# Patient Record
Sex: Female | Born: 1940
Health system: Southern US, Community
[De-identification: ages and names within clinical notes are randomized; demographics above are authoritative.]

## PROBLEM LIST (undated history)

## (undated) DIAGNOSIS — K449 Diaphragmatic hernia without obstruction or gangrene: Secondary | ICD-10-CM

## (undated) DIAGNOSIS — Z860100 Personal history of colon polyps, unspecified: Secondary | ICD-10-CM

## (undated) DIAGNOSIS — I619 Nontraumatic intracerebral hemorrhage, unspecified: Secondary | ICD-10-CM

## (undated) DIAGNOSIS — K219 Gastro-esophageal reflux disease without esophagitis: Secondary | ICD-10-CM

## (undated) DIAGNOSIS — I10 Essential (primary) hypertension: Secondary | ICD-10-CM

## (undated) DIAGNOSIS — G2581 Restless legs syndrome: Secondary | ICD-10-CM

## (undated) DIAGNOSIS — E7800 Pure hypercholesterolemia, unspecified: Secondary | ICD-10-CM

## (undated) DIAGNOSIS — D649 Anemia, unspecified: Secondary | ICD-10-CM

## (undated) DIAGNOSIS — K648 Other hemorrhoids: Secondary | ICD-10-CM

## (undated) DIAGNOSIS — H269 Unspecified cataract: Secondary | ICD-10-CM

## (undated) DIAGNOSIS — K2210 Ulcer of esophagus without bleeding: Secondary | ICD-10-CM

## (undated) DIAGNOSIS — R7303 Prediabetes: Secondary | ICD-10-CM

## (undated) DIAGNOSIS — F411 Generalized anxiety disorder: Secondary | ICD-10-CM

## (undated) DIAGNOSIS — R0602 Shortness of breath: Secondary | ICD-10-CM

## (undated) DIAGNOSIS — K222 Esophageal obstruction: Secondary | ICD-10-CM

## (undated) HISTORY — DX: Restless legs syndrome: G25.81

## (undated) HISTORY — DX: Prediabetes: R73.03

## (undated) HISTORY — DX: Personal history of colonic polyps: Z86.010

## (undated) HISTORY — DX: Nontraumatic intracerebral hemorrhage, unspecified: I61.9

## (undated) HISTORY — DX: Diaphragmatic hernia without obstruction or gangrene: K44.9

## (undated) HISTORY — DX: Personal history of colon polyps, unspecified: Z86.0100

## (undated) HISTORY — DX: Ulcer of esophagus without bleeding: K22.10

## (undated) HISTORY — DX: Generalized anxiety disorder: F41.1

## (undated) HISTORY — DX: Unspecified cataract: H26.9

## (undated) HISTORY — PX: POLYPECTOMY: SHX149

## (undated) HISTORY — DX: Gastro-esophageal reflux disease without esophagitis: K21.9

## (undated) HISTORY — PX: PARTIAL HYSTERECTOMY: SHX80

## (undated) HISTORY — DX: Anemia, unspecified: D64.9

## (undated) HISTORY — DX: Esophageal obstruction: K22.2

## (undated) HISTORY — DX: Other hemorrhoids: K64.8

## (undated) HISTORY — DX: Essential (primary) hypertension: I10

## (undated) HISTORY — PX: TONSILLECTOMY: SUR1361

## (undated) HISTORY — DX: Pure hypercholesterolemia, unspecified: E78.00

## (undated) HISTORY — DX: Shortness of breath: R06.02

## (undated) HISTORY — PX: BREAST BIOPSY: SHX20

---

## 1993-08-26 DIAGNOSIS — I619 Nontraumatic intracerebral hemorrhage, unspecified: Secondary | ICD-10-CM

## 1993-08-26 HISTORY — DX: Nontraumatic intracerebral hemorrhage, unspecified: I61.9

## 1998-05-22 ENCOUNTER — Other Ambulatory Visit: Admission: RE | Admit: 1998-05-22 | Discharge: 1998-05-22 | Payer: Self-pay | Admitting: Gastroenterology

## 1998-06-05 ENCOUNTER — Other Ambulatory Visit: Admission: RE | Admit: 1998-06-05 | Discharge: 1998-06-05 | Payer: Self-pay | Admitting: Gastroenterology

## 1999-08-21 ENCOUNTER — Other Ambulatory Visit: Admission: RE | Admit: 1999-08-21 | Discharge: 1999-08-21 | Payer: Self-pay | Admitting: Family Medicine

## 2001-03-19 ENCOUNTER — Emergency Department (HOSPITAL_COMMUNITY): Admission: EM | Admit: 2001-03-19 | Discharge: 2001-03-19 | Payer: Self-pay

## 2003-09-05 ENCOUNTER — Observation Stay (HOSPITAL_COMMUNITY): Admission: EM | Admit: 2003-09-05 | Discharge: 2003-09-06 | Payer: Self-pay | Admitting: Emergency Medicine

## 2003-09-08 ENCOUNTER — Encounter: Admission: RE | Admit: 2003-09-08 | Discharge: 2003-09-08 | Payer: Self-pay | Admitting: Family Medicine

## 2005-06-04 ENCOUNTER — Ambulatory Visit: Payer: Self-pay | Admitting: Gastroenterology

## 2005-08-05 ENCOUNTER — Other Ambulatory Visit: Admission: RE | Admit: 2005-08-05 | Discharge: 2005-08-05 | Payer: Self-pay | Admitting: Family Medicine

## 2008-05-31 ENCOUNTER — Ambulatory Visit: Payer: Self-pay | Admitting: Gastroenterology

## 2008-06-13 ENCOUNTER — Ambulatory Visit: Payer: Self-pay | Admitting: Gastroenterology

## 2010-11-05 HISTORY — PX: CHONDROPLASTY: SHX5177

## 2010-11-05 HISTORY — PX: KNEE ARTHROSCOPY W/ MENISCECTOMY: SHX1879

## 2011-01-11 NOTE — Discharge Summary (Signed)
NAME:  MONTEEN, TOOPS                            ACCOUNT NO.:  [id number]   MEDICAL RECORD NO.:  [id number]                   PATIENT TYPE:  INP   LOCATION:  4729                                 FACILITY:  [facility]   PHYSICIAN:  Leighton Roach McDiarmid, M.D.             DATE OF BIRTH:  1941-06-09   DATE OF ADMISSION:  09/05/2003  DATE OF DISCHARGE:  09/06/2003                                 DISCHARGE SUMMARY   FINAL DIAGNOSES:  1. Atypical chest pain, rule out myocardial infarction.  2. Gastroesophageal reflux disease.  3. Hypercholesterolemia.  4. Depression.  5. History of cerebral aneurysm.   PHYSICIANS:  1. Primary care physician, Dr. Nathanial Rancher at [facility].  2. Consulting physicians:  None.   PROCEDURE:  1. Chest x-ray September 05, 2003:  No acute cardiopulmonary disease.  2. EKG September 05, 2003:  Rate of 60 beats per minute, normal sinus rhythm,     no acute ST or T wave changes, right bundle branch block.  3. EKG September 06, 2003:  Rate of 67 beats per minute, normal sinus rhythm,     no acute ST or T wave changes, right bundle branch block.  4. Telemetry from January 10 to January 11. The patient was in normal sinus     rhythm the entire time.   LABORATORY DATA:  Admission labs:  White count 5.4, hemoglobin 12.5,  platelets 313. Sodium 142, potassium 3.9, chloride 105, bicarb 31, BUN 13,  creatinine 0.9, glucose 114. Liver function tests were within normal limits.  PT was 17.4, INR of 0.9. D-dimer was normal at 0.32. Initial point of care  cardiac enzymes were within normal limits; CK-MB of 1.0, troponin of less  than 0.05, myoglobin 43.3; two subsequent sets of cardiac enzymes were also  negative, CKs of 75 and 69, CK-MB of 0.9 and 0.8 and troponin of 0.01 and  0.01.   HISTORY OF PRESENT ILLNESS:  The patient is a 70 year old white female who  woke up the morning of January 10 with chest tightness around 7:00. She  described the pain as 7/10, a burning sensation  radiating into the back with  diaphoresis. NO nausea and no vomiting. The patient had never had this  experienced before. The patient also has complained of shortness of breath  with exertion for the last week. With this initial episode yesterday  morning, the patient found relief within 20 minutes without nitroglycerin.   PAST MEDICAL HISTORY:  1. Hypercholesterolemia.  2. GERD.  3. Depression.   SOCIAL HISTORY:  The patient denies any tobacco, no alcohol, no drugs. Has a  family history of a father who died of heart problems in his 21s, a sister  with a MI in her 42s, and an uncle with a MI in his 34s.   HOSPITAL COURSE:  1. Atypical chest pain, rule out myocardial infarction. Initial EKG and  point of care cardiac enzymes were within normal limits. Chest x-ray     showed no acute disease. The patient was placed on telemetry, and cardiac     enzymes were cycled every eight hours. Subsequent sets were negative x2.     The patient was pain free throughout admission, discharged home with     outpatient exercise treadmill test scheduled with [facility] with the likely cause being GERD-related symptoms.  2. Gastroesophageal reflux disease. The patient has history of     gastroesophageal reflux disease. She is on Nexium 40 mg q.h.s. The     patient admits to gaining 5 to 10 pounds over the holidays. Recent weight     gain could contribute to the one week of dyspnea on exertion and the GERD     symptoms. The patient also admits to eating jalapeno peppers multiple     times a day. The patient was instructed that this could contribute or     have caused her pain and to refrain from eating heavy meals prior to     going to bed and also to sleep with the head of the bed elevated.  3. Hypercholesterolemia. Lipid panel was obtained on admission. Cholesterol     of 214, triglycerides of 180, HDL of 49, and LDL of 129. The patient was     discharged home on her normal  home dose of Pravachol 40 mg q.d.  4. Depression. Stable throughout the admission. Continue home regimen of     Paxil 20 mg p.o. q.d.   FOLLOW UP:  The patient was instructed to followup with Dr. Nathanial Rancher at [facility] on Tuesday, January 18, at 10:45 in the morning. She was  also instructed to follow up with [facility] for an  exercise treadmill test. The date of that appointment was actually pending  at the time of discharge, but the patient was given that time.   DISCHARGE INSTRUCTIONS:  The patient was instructed to resume activity as  tolerated and return to [facility] emergency department if  pain returns.   DISCHARGE MEDICATIONS:  1. Pravachol 40 mg p.o. q.d.  2. Nexium 40 mg p.o. b.i.d. for two weeks, then return to 40 mg p.o. q.h.s.     afterwards.  3. Paxil 20 mg p.o. q.d.  4. Aspirin 81 mg p.o. q.d.  5. Amoxicillin 500 mg p.o. t.i.d. to finish a previous prescription for a     left tooth abscess.      Silas Sacramento, M.D.                         Etta Grandchild, M.D.    Jearld Pies  D:  09/06/2003  T:  09/06/2003  Job:  [id number]   cc:   Nathanial Rancher, M.D.   [facility]

## 2011-10-17 ENCOUNTER — Ambulatory Visit: Payer: Self-pay | Admitting: Gastroenterology

## 2011-10-25 ENCOUNTER — Ambulatory Visit (INDEPENDENT_AMBULATORY_CARE_PROVIDER_SITE_OTHER): Payer: Medicare PPO | Admitting: Gastroenterology

## 2011-10-25 ENCOUNTER — Other Ambulatory Visit (INDEPENDENT_AMBULATORY_CARE_PROVIDER_SITE_OTHER): Payer: Medicare PPO

## 2011-10-25 ENCOUNTER — Encounter: Payer: Self-pay | Admitting: Gastroenterology

## 2011-10-25 DIAGNOSIS — Z860100 Personal history of colon polyps, unspecified: Secondary | ICD-10-CM

## 2011-10-25 DIAGNOSIS — K219 Gastro-esophageal reflux disease without esophagitis: Secondary | ICD-10-CM

## 2011-10-25 DIAGNOSIS — E66813 Obesity, class 3: Secondary | ICD-10-CM

## 2011-10-25 DIAGNOSIS — R1310 Dysphagia, unspecified: Secondary | ICD-10-CM | POA: Insufficient documentation

## 2011-10-25 DIAGNOSIS — Z79899 Other long term (current) drug therapy: Secondary | ICD-10-CM

## 2011-10-25 LAB — IBC PANEL: Saturation Ratios: 14.7 % — ABNORMAL LOW (ref 20.0–50.0)

## 2011-10-25 LAB — FERRITIN: Ferritin: 35.3 ng/mL (ref 10.0–291.0)

## 2011-10-25 LAB — HIGH SENSITIVITY CRP: CRP, High Sensitivity: 5.64 mg/L — ABNORMAL HIGH (ref 0.000–5.000)

## 2011-10-25 MED ORDER — DEXLANSOPRAZOLE 60 MG PO CPDR: 60.0000 mg | DELAYED_RELEASE_CAPSULE | Freq: Every day | ORAL | Status: DC

## 2011-10-25 MED ORDER — PEG-KCL-NACL-NASULF-NA ASC-C 100 G PO SOLR: 1.0000 | Freq: Once | ORAL | Status: DC

## 2011-10-25 NOTE — Patient Instructions (Signed)
Your procedure has been scheduled for 11/04/2011, please follow the seperate instructions.  Please go to the basement today for your labs.  Your prescription(s) have been sent to you pharmacy.  Stop your Nexium and start the Dexilant samples.    Gastroesophageal Reflux Disease, Adult Gastroesophageal reflux disease (GERD) happens when acid from your stomach flows up into the esophagus. When acid comes in contact with the esophagus, the acid causes soreness (inflammation) in the esophagus. Over time, GERD may create small holes (ulcers) in the lining of the esophagus. CAUSES   Increased body weight. This puts pressure on the stomach, making acid rise from the stomach into the esophagus.   Smoking. This increases acid production in the stomach.   Drinking alcohol. This causes decreased pressure in the lower esophageal sphincter (valve or ring of muscle between the esophagus and stomach), allowing acid from the stomach into the esophagus.   Late evening meals and a full stomach. This increases pressure and acid production in the stomach.   A malformed lower esophageal sphincter.  Sometimes, no cause is found. SYMPTOMS   Burning pain in the lower part of the mid-chest behind the breastbone and in the mid-stomach area. This may occur twice a week or more often.   Trouble swallowing.   Sore throat.   Dry cough.   Asthma-like symptoms including chest tightness, shortness of breath, or wheezing.  DIAGNOSIS  Your caregiver may be able to diagnose GERD based on your symptoms. In some cases, X-rays and other tests may be done to check for complications or to check the condition of your stomach and esophagus. TREATMENT  Your caregiver may recommend over-the-counter or prescription medicines to help decrease acid production. Ask your caregiver before starting or adding any new medicines.  HOME CARE INSTRUCTIONS   Change the factors that you can control. Ask your caregiver for guidance  concerning weight loss, quitting smoking, and alcohol consumption.   Avoid foods and drinks that make your symptoms worse, such as:   Caffeine or alcoholic drinks.   Chocolate.   Peppermint or mint flavorings.   Garlic and onions.   Spicy foods.   Citrus fruits, such as oranges, lemons, or limes.   Tomato-based foods such as sauce, chili, salsa, and pizza.   Fried and fatty foods.   Avoid lying down for the 3 hours prior to your bedtime or prior to taking a nap.   Eat small, frequent meals instead of large meals.   Wear loose-fitting clothing. Do not wear anything tight around your waist that causes pressure on your stomach.   Raise the head of your bed 6 to 8 inches with wood blocks to help you sleep. Extra pillows will not help.   Only take over-the-counter or prescription medicines for pain, discomfort, or fever as directed by your caregiver.   Do not take aspirin, ibuprofen, or other nonsteroidal anti-inflammatory drugs (NSAIDs).  SEEK IMMEDIATE MEDICAL CARE IF:   You have pain in your arms, neck, jaw, teeth, or back.   Your pain increases or changes in intensity or duration.   You develop nausea, vomiting, or sweating (diaphoresis).   You develop shortness of breath, or you faint.   Your vomit is green, yellow, black, or looks like coffee grounds or blood.   Your stool is red, bloody, or black.  These symptoms could be signs of other problems, such as heart disease, gastric bleeding, or esophageal bleeding. MAKE SURE YOU:   Understand these instructions.   Will watch your  condition.   Will get help right away if you are not doing well or get worse.  Document Released: 05/22/2005 Document Revised: 04/24/2011 Document Reviewed: 03/01/2011 [facility] Patient Information 2012 [place], [place].

## 2011-10-25 NOTE — Progress Notes (Signed)
History of Present Illness:  This is a pleasant 71 year old Caucasian female with chronic GERD who has had several months of worsening acid reflux partially responsive to Nexium 40 mg before breakfast. She continues with progressive solid food dysphagia and some reflux symptoms. Her dysphagia is for solid foods and is located in the distal substernal area. She denies painful swallowing or a hepatobiliary complaints. Review of her chart shows long history of multiple recurrent colon polyps, and last exam was 5 years ago. It see where she has had previous endoscopic exam. She has been on Fosamax 70 mg every 7 days, but this is being held by her primary care physician pending GI evaluation. Her appetite is good her weight is stable. Family history is noncontributory.  I have reviewed this patient's present history, medical and surgical past history, allergies and medications.     ROS: The remainder of the 10 point ROS is negative... No history of Raynaud's phenomenon or other symptoms of collagen vascular disease.  Allergies  Allergen Reactions  . Simvastatin     Leg pain   Outpatient Prescriptions Prior to Visit  Medication Sig Dispense Refill  . ALPRAZolam (XANAX) 0.5 MG tablet Take 0.5 mg by mouth 3 (three) times daily as needed.      Marland Kitchen aspirin 81 MG tablet Take 81 mg by mouth daily.      . Calcium-Vitamin D 600-125 MG-UNIT TABS Take 1 tablet by mouth daily.      . Multiple Vitamin (MULTIVITAMIN) capsule Take 1 capsule by mouth daily.      Marland Kitchen PARoxetine (PAXIL) 20 MG tablet Take 20 mg by mouth every morning.      . vitamin E 400 UNIT capsule Take 400 Units by mouth daily.      Marland Kitchen esomeprazole (NEXIUM) 40 MG capsule Take 40 mg by mouth daily before breakfast.      . alendronate (FOSAMAX) 70 MG tablet Take 70 mg by mouth every 7 (seven) days. Take with a full glass of water on an empty stomach....(ON HOLD)       Past Medical History  Diagnosis Date  . Personal history of colonic polyps 1999   villous adenoma  . Hiatal hernia   . Esophageal reflux   . Cerebral hemorrhage 1995  . Vitamin d deficiency   . Hypercholesterolemia   . Osteoporosis   . Prediabetes   . Anxiety state, unspecified   . Restless legs syndrome (RLS)   . Dysphagia    Past Surgical History  Procedure Date  . Partial hysterectomy age [age]  . Tonsillectomy   . Breast biopsy     bilateral  . Knee arthroscopy w/ meniscectomy 11/05/2010  . Chondroplasty 11/05/2010   History   Social History  . Marital Status: Married    Spouse Name: N/A    Number of Children: 2  . Years of Education: N/A   Occupational History  . retired works part time at [employer]    Social History Main Topics  . Smoking status: Former Games developer  . Smokeless tobacco: Never Used  . Alcohol Use: No  . Drug Use: No  . Sexually Active: None   Other Topics Concern  . None   Social History Narrative  . None   Family History  Problem Relation Age of Onset  . Hypertension Father   . Tuberculosis Mother   . Breast cancer Sister   . Mental illness Sister   . Colon cancer Neg Hx  Physical Exam: Healthy appearing patient in no distress. Blood pressure today 118/60 and pulse is 80 and regular. BMI is 42. Cannot appreciate stigmata of chronic liver disease. Examination of the oropharynx and neck area is unremarkable. General well developed well nourished patient in no acute distress, appearing her stated age Eyes PERRLA, no icterus, fundoscopic exam per opthamologist Skin no lesions noted Neck supple, no adenopathy, no thyroid enlargement, no tenderness Chest clear to percussion and auscultation Heart no significant murmurs, gallops or rubs noted Abdomen no hepatosplenomegaly masses or tenderness, BS normal.  Extremities no acute joint lesions, edema, phlebitis or evidence of cellulitis. Neurologic patient oriented x 3, cranial nerves intact, no focal neurologic deficits noted. Psychological mental status normal and  normal affect.  Assessment and plan: Chronic GERD with probable peptic stricture of her esophagus. I have scheduled endoscopy and possible dilatation, and we'll let her try Dexilant 60 mg before breakfast with strict antireflux maneuvers. She also is due for followup colonoscopy exam or her history of colon polyps. Chart review does show an endoscopic exam in 1999. At that time she had a moderately large hiatal hernia and erosive esophagitis.  Encounter Diagnoses  Name Primary?  Marland Kitchen Dysphagia Yes  . History of colon polyps

## 2011-10-28 ENCOUNTER — Encounter: Payer: Self-pay | Admitting: Gastroenterology

## 2011-10-28 LAB — FOLATE: Folate: >24.8 ng/mL (ref >5.9)

## 2011-10-30 ENCOUNTER — Telehealth: Payer: Self-pay | Admitting: Gastroenterology

## 2011-10-30 NOTE — Telephone Encounter (Signed)
Message copied by Arna Snipe on Wed Oct 30, 2011 10:37 AM ------      Message from: Harlow Mares D      Created: Fri Oct 25, 2011  4:39 PM       Please bill pt for no show on 10/17/2011

## 2011-11-01 ENCOUNTER — Telehealth: Payer: Self-pay

## 2011-11-01 ENCOUNTER — Ambulatory Visit (INDEPENDENT_AMBULATORY_CARE_PROVIDER_SITE_OTHER): Payer: Medicare PPO | Admitting: Gastroenterology

## 2011-11-01 DIAGNOSIS — E538 Deficiency of other specified B group vitamins: Secondary | ICD-10-CM

## 2011-11-01 MED ORDER — CYANOCOBALAMIN 1000 MCG/ML IJ SOLN
1000.0000 ug | INTRAMUSCULAR | Status: AC
  Administered 2011-11-01 – 2011-11-15 (×3): 1000 ug via INTRAMUSCULAR

## 2011-11-01 MED ORDER — CYANOCOBALAMIN 1000 MCG/ML IJ SOLN: 1000.0000 ug | Freq: Once | INTRAMUSCULAR | Status: DC

## 2011-11-01 NOTE — Telephone Encounter (Signed)
Spoke with patient and informed her of lab results and patient will come in today for Vitamin B12 injections.

## 2011-11-01 NOTE — Telephone Encounter (Signed)
Message copied by Jessee Avers on Fri Nov 01, 2011 10:12 AM ------      Message from: Mardella Layman      Created: Tue Oct 29, 2011  9:38 AM       Start B12 shots and therapy

## 2011-11-04 ENCOUNTER — Ambulatory Visit (AMBULATORY_SURGERY_CENTER): Payer: Medicare PPO | Admitting: Gastroenterology

## 2011-11-04 ENCOUNTER — Encounter: Payer: Self-pay | Admitting: Gastroenterology

## 2011-11-04 VITALS — BP 141/78 | HR 70 | Temp 96.2°F | Resp 20 | Ht 65.0 in | Wt 252.0 lb

## 2011-11-04 DIAGNOSIS — D126 Benign neoplasm of colon, unspecified: Secondary | ICD-10-CM

## 2011-11-04 DIAGNOSIS — K222 Esophageal obstruction: Secondary | ICD-10-CM

## 2011-11-04 DIAGNOSIS — Z1211 Encounter for screening for malignant neoplasm of colon: Secondary | ICD-10-CM

## 2011-11-04 DIAGNOSIS — Z860100 Personal history of colon polyps, unspecified: Secondary | ICD-10-CM | POA: Insufficient documentation

## 2011-11-04 DIAGNOSIS — R1310 Dysphagia, unspecified: Secondary | ICD-10-CM

## 2011-11-04 DIAGNOSIS — K219 Gastro-esophageal reflux disease without esophagitis: Secondary | ICD-10-CM

## 2011-11-04 MED ORDER — SODIUM CHLORIDE 0.9 % IV SOLN: 500.0000 mL | INTRAVENOUS | Status: DC

## 2011-11-04 MED ORDER — ESOMEPRAZOLE MAGNESIUM 40 MG PO CPDR: 40.0000 mg | DELAYED_RELEASE_CAPSULE | Freq: Two times a day (BID) | ORAL | Status: DC

## 2011-11-04 NOTE — Patient Instructions (Signed)
New rx for nexium 40 mg take one 2 x per day.  Rx was sent by computer to [facility] at [place], [place].  Discontinue Dexilant.  You may resume your other medications today.Appointment may to see Dr. Jarold Motto in the office on the 3rd floor for Thursday, April 11 at 8:45.  Handouts given to the pt on polyps, GERD, hiatal hernia and esophageal dilatation diet to follow the rest of the day.  You may resume your prior diet Tuesday AM.  YOU HAD AN ENDOSCOPIC PROCEDURE TODAY AT THE [facility]: Refer to the procedure report that was given to you for any specific questions about what was found during the examination.  If the procedure report does not answer your questions, please call your gastroenterologist to clarify.  If you requested that your care partner not be given the details of your procedure findings, then the procedure report has been included in a sealed envelope for you to review at your convenience later.  YOU SHOULD EXPECT: Some feelings of bloating in the abdomen. Passage of more gas than usual.  Walking can help get rid of the air that was put into your GI tract during the procedure and reduce the bloating. If you had a lower endoscopy (such as a colonoscopy or flexible sigmoidoscopy) you may notice spotting of blood in your stool or on the toilet paper. If you underwent a bowel prep for your procedure, then you may not have a normal bowel movement for a few days.  DIET:    Drink plenty of fluids but you should avoid alcoholic beverages for 24 hours.  Please follow the dilatation diet handout the rest of the day.  You may resume your prior diet Tuesday am.  ACTIVITY: Your care partner should take you home directly after the procedure.  You should plan to take it easy, moving slowly for the rest of the day.  You can resume normal activity the day after the procedure however you should NOT DRIVE or use heavy machinery for 24 hours (because of the sedation medicines used during the test).     SYMPTOMS TO REPORT IMMEDIATELY: A gastroenterologist can be reached at any hour.  During normal business hours, 8:30 AM to 5:00 PM Monday through Friday, call [phone].  After hours and on weekends, please call the GI answering service at [phone] who will take a message and have the physician on call contact you.   Following lower endoscopy (colonoscopy or flexible sigmoidoscopy):  Excessive amounts of blood in the stool  Significant tenderness or worsening of abdominal pains  Swelling of the abdomen that is new, acute  Fever of 100F or higher  Following upper endoscopy (EGD)  Vomiting of blood or coffee ground material  New chest pain or pain under the shoulder blades  Painful or persistently difficult swallowing  New shortness of breath  Fever of 100F or higher  Black, tarry-looking stools  FOLLOW UP: If any biopsies were taken you will be contacted by phone or by letter within the next 1-3 weeks.  Call your gastroenterologist if you have not heard about the biopsies in 3 weeks.  Our staff will call the home number listed on your records the next business day following your procedure to check on you and address any questions or concerns that you may have at that time regarding the information given to you following your procedure. This is a courtesy call and so if there is no answer at the home number and  we have not heard from you through the emergency physician on call, we will assume that you have returned to your regular daily activities without incident.  SIGNATURES/CONFIDENTIALITY: You and/or your care partner have signed paperwork which will be entered into your electronic medical record.  These signatures attest to the fact that that the information above on your After Visit Summary has been reviewed and is understood.  Full responsibility of the confidentiality of this discharge information lies with you and/or your care-partner.

## 2011-11-04 NOTE — Op Note (Signed)
[facility] [address] [place], [place]  [address]  COLONOSCOPY PROCEDURE REPORT  PATIENT:  Alison, York  MR#:  [id number] BIRTHDATE:  01-May-1941, 71 yrs. old  GENDER:  female ENDOSCOPIST:  Vania Rea. Jarold Motto, MD, [facility] REF. BY: PROCEDURE DATE:  11/04/2011 PROCEDURE:  Colonoscopy with snare polypectomy ASA CLASS:  Class II INDICATIONS:  history of pre-cancerous (adenomatous) colon polyps  MEDICATIONS:   propofol (Diprivan) 300 mg IV  DESCRIPTION OF PROCEDURE:   After the risks and benefits and of the procedure were explained, informed consent was obtained. Digital rectal exam was performed and revealed no abnormalities. The LB CF-H180AL [id number] endoscope was introduced through the anus and advanced to the cecum, which was identified by both the appendix and ileocecal valve.  The quality of the prep was excellent, using MoviPrep.  The instrument was then slowly withdrawn as the colon was fully examined. <<PROCEDUREIMAGES>>  FINDINGS:  ENDOSCOPIC FINDINGS:  A sessile polyp was found.  cm. cecal polyp HOT SNARE REMOVED.SEE PICTURES.VERY REDUNDANT COLON NOTED. This was otherwise a normal examination of the colon. Retroflexed views in the rectum revealed no abnormalities.    The scope was then withdrawn from the patient and the procedure completed.  COMPLICATIONS:  None ENDOSCOPIC IMPRESSION: 1) Sessile polyp 2) Otherwise normal examination R/O ADENOMA. RECOMMENDATIONS: 1) Repeat colonoscopy in 5 years if polyp adenomatous; otherwise 10 years  REPEAT EXAM:  No  ______________________________ Vania Rea. Jarold Motto, MD, [facility]  CC:  Burnell Blanks, MD  n. Rosalie DoctorVania Rea. Jeylin Woodmansee at 11/04/2011 02:03 PM  Gwenette Greet, [phone]

## 2011-11-04 NOTE — Progress Notes (Signed)
No complaints noted in the recovery room. Maw  Patient did not experience any of the following events: a burn prior to discharge; a fall within the facility; wrong site/side/patient/procedure/implant event; or a hospital transfer or hospital admission upon discharge from the facility. (G8907) Patient did not have preoperative order for IV antibiotic SSI prophylaxis. (G8918)  

## 2011-11-04 NOTE — Progress Notes (Signed)
PROPOFOL PER S CAMP CRNA. SEE SCANNED INTRA PROCEDURE REPORT. EWM  PT'S HEART RATE DECREASED TO 46 DURING ADVANCEMENT TO CECUM. MD MADE AWARE PER S CAMP CRNA, SCOPE HELD AND HEART RATE INCREASED TO 50'S, THEN TO 60'S. SATS MAINTAINED STABLE 97% WITH EPISODE. EWM  PT TOLERATED BOTH PROCEDURES WITH NO DISTRESS NOTED. PT MAINTAINED SATS WITH SKIN WARM AND DRY. EWM

## 2011-11-04 NOTE — Op Note (Signed)
[facility] [address] [place], [place]  [address]  ENDOSCOPY PROCEDURE REPORT  PATIENT:  Alison York, Alison York  MR#:  [id number] BIRTHDATE:  1941-05-29, 71 yrs. old  GENDER:  female  ENDOSCOPIST:  Vania Rea. Jarold Motto, MD, [facility] Referred by:  PROCEDURE DATE:  11/04/2011 PROCEDURE:  EGD, diagnostic 43235, [place] Dilation of Esophagus ASA CLASS:  Class II INDICATIONS:  GERD, dysphagia  MEDICATIONS:   There was residual sedation effect present from prior procedure., propofol (Diprivan) 120 mg IV TOPICAL ANESTHETIC:  DESCRIPTION OF PROCEDURE:   After the risks and benefits of the procedure were explained, informed consent was obtained.  The LB GIF-H180 [id number] endoscope was introduced through the mouth and advanced to the second portion of the duodenum.  The instrument was slowly withdrawn as the mucosa was fully examined. <<PROCEDUREIMAGES>>  A hiatal hernia was found. LARE PROLAPSING HIATIAL HERNIA NOTED.SEE PICTURES.  Multiple erosions were found at the gastroesophageal junction. NO DEFINITE STRICTURE NOTED.DILATED # 94F MALONEY DILATOR.NO HEME OR PAIN.  Otherwise the examination was normal.    Retroflexed views revealed a hiatal hernia.    The scope was then withdrawn from the patient and the procedure completed.  COMPLICATIONS:  None  ENDOSCOPIC IMPRESSION: 1) Hiatal hernia 2) Erosions, multiple at the gastroesophageal junction 3) Otherwise normal examination 4) A hiatal hernia LARGE HH WITH CHRONIC EROSIVE ESOPHAGITIS AND STRICTURE DILATED.  RECOMMENDATIONS: 1) Clear liquids until, then soft foods rest iof day. Resume prior diet tomorrow. 2) continue current medications 3) continue PPI  ______________________________ Vania Rea. Jarold Motto, MD, [facility]  CC:  Burnell Blanks, MD  n. Rosalie DoctorVania Rea. Rogerick Baldwin at 11/04/2011 02:09 PM  Gwenette Greet, [phone]

## 2011-11-05 ENCOUNTER — Telehealth: Payer: Self-pay | Admitting: *Deleted

## 2011-11-05 NOTE — Telephone Encounter (Signed)
  Follow up Call-  Call back number 11/04/2011  Post procedure Call Back phone  # [phone]  Permission to leave phone message Yes     Patient questions:  Do you have a fever, pain , or abdominal swelling? no Pain Score  0 *  Have you tolerated food without any problems? yes  Have you been able to return to your normal activities? yes  Do you have any questions about your discharge instructions: Diet   no Medications  no Follow up visit  no  Do you have questions or concerns about your Care? no  Actions: * If pain score is 4 or above: No action needed, pain <4.

## 2011-11-08 ENCOUNTER — Encounter: Payer: Self-pay | Admitting: Gastroenterology

## 2011-11-08 ENCOUNTER — Ambulatory Visit (INDEPENDENT_AMBULATORY_CARE_PROVIDER_SITE_OTHER): Payer: Medicare PPO | Admitting: Gastroenterology

## 2011-11-08 DIAGNOSIS — E538 Deficiency of other specified B group vitamins: Secondary | ICD-10-CM

## 2011-11-15 ENCOUNTER — Ambulatory Visit (INDEPENDENT_AMBULATORY_CARE_PROVIDER_SITE_OTHER): Payer: Medicare PPO | Admitting: Gastroenterology

## 2011-11-15 DIAGNOSIS — E538 Deficiency of other specified B group vitamins: Secondary | ICD-10-CM

## 2011-11-15 MED ORDER — CYANOCOBALAMIN 1000 MCG/ML IJ SOLN
1000.0000 ug | INTRAMUSCULAR | Status: AC
  Administered 2011-12-16 – 2012-03-18 (×4): 1000 ug via INTRAMUSCULAR

## 2011-12-02 ENCOUNTER — Encounter: Payer: Self-pay | Admitting: *Deleted

## 2011-12-05 ENCOUNTER — Ambulatory Visit (INDEPENDENT_AMBULATORY_CARE_PROVIDER_SITE_OTHER): Payer: Medicare PPO | Admitting: Gastroenterology

## 2011-12-05 ENCOUNTER — Encounter: Payer: Self-pay | Admitting: Gastroenterology

## 2011-12-05 VITALS — BP 122/70 | HR 68 | Ht 65.0 in | Wt 254.2 lb

## 2011-12-05 DIAGNOSIS — K219 Gastro-esophageal reflux disease without esophagitis: Secondary | ICD-10-CM

## 2011-12-05 DIAGNOSIS — Z86010 Personal history of colonic polyps: Secondary | ICD-10-CM

## 2011-12-05 DIAGNOSIS — R141 Gas pain: Secondary | ICD-10-CM

## 2011-12-05 DIAGNOSIS — R142 Eructation: Secondary | ICD-10-CM

## 2011-12-05 NOTE — Progress Notes (Signed)
History of Present Illness: This is a 71 year old Caucasian female who recently underwent colonoscopy with removal of a cecal adenoma. She is scheduled for followup exam in 5 years. She had severe reflux symptoms with dysphagia, underwent endoscopy with esophageal dilatation, and is currently asymptomatic on Nexium 40 mg twice a day. Her main complaint is abdominal gas and bloating made worse by fiber supplements. She denies any hepatobiliary complaints. Her dysphasia has completely resolved.    Current Medications, Allergies, Past Medical History, Past Surgical History, Family History and Social History were reviewed in [employer] record.   Assessment and plan: Chronic GERD which may require twice a day Nexium because of her recurrent peptic strictures over many years of followup.. We will continue twice a day therapy, and try to taper her Nexium as tolerated. I placed her on a " low gas diet" for review. She is to continue her other medications as listed and reviewed in her record with primary care followup as scheduled. Patient does also have B12 deficiency, and is on parenteral replacement therapy. She may have an element of bacterial overgrowth syndrome. Review of her record shows colon polyps going back some 15 years. No diagnosis found.

## 2011-12-05 NOTE — Patient Instructions (Addendum)
Continue your Nexium twice a day. Follow up in one year.  Follow the low gas diet given today.

## 2011-12-16 ENCOUNTER — Ambulatory Visit (INDEPENDENT_AMBULATORY_CARE_PROVIDER_SITE_OTHER): Payer: Medicare PPO | Admitting: Gastroenterology

## 2011-12-16 DIAGNOSIS — E538 Deficiency of other specified B group vitamins: Secondary | ICD-10-CM

## 2012-01-06 ENCOUNTER — Encounter: Payer: Self-pay | Admitting: *Deleted

## 2012-01-15 ENCOUNTER — Ambulatory Visit (INDEPENDENT_AMBULATORY_CARE_PROVIDER_SITE_OTHER): Payer: Medicare PPO | Admitting: Gastroenterology

## 2012-01-15 DIAGNOSIS — E538 Deficiency of other specified B group vitamins: Secondary | ICD-10-CM

## 2012-02-17 ENCOUNTER — Ambulatory Visit (INDEPENDENT_AMBULATORY_CARE_PROVIDER_SITE_OTHER): Payer: Medicare PPO | Admitting: Gastroenterology

## 2012-02-17 ENCOUNTER — Other Ambulatory Visit: Payer: Self-pay | Admitting: *Deleted

## 2012-02-17 DIAGNOSIS — E538 Deficiency of other specified B group vitamins: Secondary | ICD-10-CM

## 2012-02-17 DIAGNOSIS — R109 Unspecified abdominal pain: Secondary | ICD-10-CM

## 2012-03-18 ENCOUNTER — Ambulatory Visit (INDEPENDENT_AMBULATORY_CARE_PROVIDER_SITE_OTHER): Payer: Medicare PPO | Admitting: Gastroenterology

## 2012-03-18 DIAGNOSIS — E538 Deficiency of other specified B group vitamins: Secondary | ICD-10-CM

## 2012-04-28 ENCOUNTER — Ambulatory Visit (INDEPENDENT_AMBULATORY_CARE_PROVIDER_SITE_OTHER): Payer: Medicare PPO | Admitting: Gastroenterology

## 2012-04-28 DIAGNOSIS — E538 Deficiency of other specified B group vitamins: Secondary | ICD-10-CM

## 2012-04-28 MED ORDER — CYANOCOBALAMIN 1000 MCG/ML IJ SOLN
1000.0000 ug | INTRAMUSCULAR | Status: DC
  Administered 2012-04-28: 1000 ug via INTRAMUSCULAR

## 2012-05-26 ENCOUNTER — Telehealth: Payer: Self-pay | Admitting: Gastroenterology

## 2012-05-27 MED ORDER — CYANOCOBALAMIN 1000 MCG/ML IJ SOLN: INTRAMUSCULAR | Status: DC

## 2012-05-27 NOTE — Telephone Encounter (Signed)
Pt requested we order her B12 for self injection d/t the travel distance. Ordered.

## 2012-11-17 ENCOUNTER — Other Ambulatory Visit: Payer: Self-pay | Admitting: Gastroenterology

## 2013-01-03 ENCOUNTER — Other Ambulatory Visit: Payer: Self-pay | Admitting: Gastroenterology

## 2013-01-04 NOTE — Telephone Encounter (Signed)
PATIENT WILL NEED AN OFFICE VISIT FOR FURTHER REFILLS  

## 2013-12-13 ENCOUNTER — Other Ambulatory Visit: Payer: Self-pay | Admitting: Gastroenterology

## 2014-04-13 ENCOUNTER — Encounter: Payer: Self-pay | Admitting: Gastroenterology

## 2014-11-09 DIAGNOSIS — E780 Pure hypercholesterolemia: Secondary | ICD-10-CM | POA: Diagnosis not present

## 2014-11-09 DIAGNOSIS — R7309 Other abnormal glucose: Secondary | ICD-10-CM | POA: Diagnosis not present

## 2014-11-09 DIAGNOSIS — E538 Deficiency of other specified B group vitamins: Secondary | ICD-10-CM | POA: Diagnosis not present

## 2014-11-09 DIAGNOSIS — E559 Vitamin D deficiency, unspecified: Secondary | ICD-10-CM | POA: Diagnosis not present

## 2014-11-11 DIAGNOSIS — E780 Pure hypercholesterolemia: Secondary | ICD-10-CM | POA: Diagnosis not present

## 2014-11-11 DIAGNOSIS — F419 Anxiety disorder, unspecified: Secondary | ICD-10-CM | POA: Diagnosis not present

## 2014-11-11 DIAGNOSIS — Z1389 Encounter for screening for other disorder: Secondary | ICD-10-CM | POA: Diagnosis not present

## 2014-11-11 DIAGNOSIS — Z23 Encounter for immunization: Secondary | ICD-10-CM | POA: Diagnosis not present

## 2014-11-11 DIAGNOSIS — E559 Vitamin D deficiency, unspecified: Secondary | ICD-10-CM | POA: Diagnosis not present

## 2014-11-11 DIAGNOSIS — N183 Chronic kidney disease, stage 3 (moderate): Secondary | ICD-10-CM | POA: Diagnosis not present

## 2014-11-11 DIAGNOSIS — R7309 Other abnormal glucose: Secondary | ICD-10-CM | POA: Diagnosis not present

## 2014-11-11 DIAGNOSIS — Z9181 History of falling: Secondary | ICD-10-CM | POA: Diagnosis not present

## 2015-01-05 DIAGNOSIS — Z803 Family history of malignant neoplasm of breast: Secondary | ICD-10-CM | POA: Diagnosis not present

## 2015-01-05 DIAGNOSIS — Z1231 Encounter for screening mammogram for malignant neoplasm of breast: Secondary | ICD-10-CM | POA: Diagnosis not present

## 2015-01-05 DIAGNOSIS — M810 Age-related osteoporosis without current pathological fracture: Secondary | ICD-10-CM | POA: Diagnosis not present

## 2015-03-28 DIAGNOSIS — M5414 Radiculopathy, thoracic region: Secondary | ICD-10-CM | POA: Diagnosis not present

## 2015-03-28 DIAGNOSIS — Z6841 Body Mass Index (BMI) 40.0 and over, adult: Secondary | ICD-10-CM | POA: Diagnosis not present

## 2015-04-25 DIAGNOSIS — Z9181 History of falling: Secondary | ICD-10-CM | POA: Diagnosis not present

## 2015-04-25 DIAGNOSIS — M5414 Radiculopathy, thoracic region: Secondary | ICD-10-CM | POA: Diagnosis not present

## 2015-04-25 DIAGNOSIS — Z6841 Body Mass Index (BMI) 40.0 and over, adult: Secondary | ICD-10-CM | POA: Diagnosis not present

## 2015-04-25 DIAGNOSIS — Z1389 Encounter for screening for other disorder: Secondary | ICD-10-CM | POA: Diagnosis not present

## 2015-04-25 DIAGNOSIS — Z139 Encounter for screening, unspecified: Secondary | ICD-10-CM | POA: Diagnosis not present

## 2015-05-16 DIAGNOSIS — Z79899 Other long term (current) drug therapy: Secondary | ICD-10-CM | POA: Diagnosis not present

## 2015-05-16 DIAGNOSIS — N183 Chronic kidney disease, stage 3 (moderate): Secondary | ICD-10-CM | POA: Diagnosis not present

## 2015-05-16 DIAGNOSIS — E559 Vitamin D deficiency, unspecified: Secondary | ICD-10-CM | POA: Diagnosis not present

## 2015-05-16 DIAGNOSIS — R7309 Other abnormal glucose: Secondary | ICD-10-CM | POA: Diagnosis not present

## 2015-05-16 DIAGNOSIS — E538 Deficiency of other specified B group vitamins: Secondary | ICD-10-CM | POA: Diagnosis not present

## 2015-05-16 DIAGNOSIS — E780 Pure hypercholesterolemia: Secondary | ICD-10-CM | POA: Diagnosis not present

## 2015-05-19 DIAGNOSIS — F419 Anxiety disorder, unspecified: Secondary | ICD-10-CM | POA: Diagnosis not present

## 2015-05-19 DIAGNOSIS — R7301 Impaired fasting glucose: Secondary | ICD-10-CM | POA: Diagnosis not present

## 2015-05-19 DIAGNOSIS — E538 Deficiency of other specified B group vitamins: Secondary | ICD-10-CM | POA: Diagnosis not present

## 2015-05-19 DIAGNOSIS — E559 Vitamin D deficiency, unspecified: Secondary | ICD-10-CM | POA: Diagnosis not present

## 2015-05-19 DIAGNOSIS — Z23 Encounter for immunization: Secondary | ICD-10-CM | POA: Diagnosis not present

## 2015-05-19 DIAGNOSIS — N183 Chronic kidney disease, stage 3 (moderate): Secondary | ICD-10-CM | POA: Diagnosis not present

## 2015-05-19 DIAGNOSIS — E780 Pure hypercholesterolemia: Secondary | ICD-10-CM | POA: Diagnosis not present

## 2015-05-19 DIAGNOSIS — K219 Gastro-esophageal reflux disease without esophagitis: Secondary | ICD-10-CM | POA: Diagnosis not present

## 2015-11-16 DIAGNOSIS — E7800 Pure hypercholesterolemia, unspecified: Secondary | ICD-10-CM | POA: Diagnosis not present

## 2015-11-16 DIAGNOSIS — E538 Deficiency of other specified B group vitamins: Secondary | ICD-10-CM | POA: Diagnosis not present

## 2015-11-16 DIAGNOSIS — E559 Vitamin D deficiency, unspecified: Secondary | ICD-10-CM | POA: Diagnosis not present

## 2015-11-16 DIAGNOSIS — R7303 Prediabetes: Secondary | ICD-10-CM | POA: Diagnosis not present

## 2015-11-20 DIAGNOSIS — F419 Anxiety disorder, unspecified: Secondary | ICD-10-CM | POA: Diagnosis not present

## 2015-11-20 DIAGNOSIS — Z23 Encounter for immunization: Secondary | ICD-10-CM | POA: Diagnosis not present

## 2015-11-20 DIAGNOSIS — E538 Deficiency of other specified B group vitamins: Secondary | ICD-10-CM | POA: Diagnosis not present

## 2015-11-20 DIAGNOSIS — R7303 Prediabetes: Secondary | ICD-10-CM | POA: Diagnosis not present

## 2015-11-20 DIAGNOSIS — N183 Chronic kidney disease, stage 3 (moderate): Secondary | ICD-10-CM | POA: Diagnosis not present

## 2015-11-20 DIAGNOSIS — Z6841 Body Mass Index (BMI) 40.0 and over, adult: Secondary | ICD-10-CM | POA: Diagnosis not present

## 2015-11-20 DIAGNOSIS — E559 Vitamin D deficiency, unspecified: Secondary | ICD-10-CM | POA: Diagnosis not present

## 2015-11-20 DIAGNOSIS — E7800 Pure hypercholesterolemia, unspecified: Secondary | ICD-10-CM | POA: Diagnosis not present

## 2015-11-20 DIAGNOSIS — K219 Gastro-esophageal reflux disease without esophagitis: Secondary | ICD-10-CM | POA: Diagnosis not present

## 2016-01-05 DIAGNOSIS — H5231 Anisometropia: Secondary | ICD-10-CM | POA: Diagnosis not present

## 2016-01-05 DIAGNOSIS — H5210 Myopia, unspecified eye: Secondary | ICD-10-CM | POA: Diagnosis not present

## 2016-02-02 DIAGNOSIS — Z1231 Encounter for screening mammogram for malignant neoplasm of breast: Secondary | ICD-10-CM | POA: Diagnosis not present

## 2016-02-02 DIAGNOSIS — Z803 Family history of malignant neoplasm of breast: Secondary | ICD-10-CM | POA: Diagnosis not present

## 2016-05-23 DIAGNOSIS — N183 Chronic kidney disease, stage 3 (moderate): Secondary | ICD-10-CM | POA: Diagnosis not present

## 2016-05-23 DIAGNOSIS — E559 Vitamin D deficiency, unspecified: Secondary | ICD-10-CM | POA: Diagnosis not present

## 2016-05-23 DIAGNOSIS — R7303 Prediabetes: Secondary | ICD-10-CM | POA: Diagnosis not present

## 2016-05-23 DIAGNOSIS — E538 Deficiency of other specified B group vitamins: Secondary | ICD-10-CM | POA: Diagnosis not present

## 2016-05-23 DIAGNOSIS — Z79899 Other long term (current) drug therapy: Secondary | ICD-10-CM | POA: Diagnosis not present

## 2016-05-23 DIAGNOSIS — E7800 Pure hypercholesterolemia, unspecified: Secondary | ICD-10-CM | POA: Diagnosis not present

## 2016-05-27 DIAGNOSIS — Z1389 Encounter for screening for other disorder: Secondary | ICD-10-CM | POA: Diagnosis not present

## 2016-05-27 DIAGNOSIS — K219 Gastro-esophageal reflux disease without esophagitis: Secondary | ICD-10-CM | POA: Diagnosis not present

## 2016-05-27 DIAGNOSIS — F419 Anxiety disorder, unspecified: Secondary | ICD-10-CM | POA: Diagnosis not present

## 2016-05-27 DIAGNOSIS — Z9181 History of falling: Secondary | ICD-10-CM | POA: Diagnosis not present

## 2016-05-27 DIAGNOSIS — Z23 Encounter for immunization: Secondary | ICD-10-CM | POA: Diagnosis not present

## 2016-05-27 DIAGNOSIS — N183 Chronic kidney disease, stage 3 (moderate): Secondary | ICD-10-CM | POA: Diagnosis not present

## 2016-05-27 DIAGNOSIS — E7800 Pure hypercholesterolemia, unspecified: Secondary | ICD-10-CM | POA: Diagnosis not present

## 2016-05-27 DIAGNOSIS — Z139 Encounter for screening, unspecified: Secondary | ICD-10-CM | POA: Diagnosis not present

## 2016-05-27 DIAGNOSIS — R7303 Prediabetes: Secondary | ICD-10-CM | POA: Diagnosis not present

## 2016-08-12 ENCOUNTER — Encounter: Payer: Self-pay | Admitting: *Deleted

## 2016-08-26 HISTORY — PX: COLONOSCOPY: SHX174

## 2016-09-18 DIAGNOSIS — D485 Neoplasm of uncertain behavior of skin: Secondary | ICD-10-CM | POA: Diagnosis not present

## 2016-09-18 DIAGNOSIS — L820 Inflamed seborrheic keratosis: Secondary | ICD-10-CM | POA: Diagnosis not present

## 2016-09-23 ENCOUNTER — Encounter: Payer: Self-pay | Admitting: Internal Medicine

## 2016-11-04 DIAGNOSIS — K219 Gastro-esophageal reflux disease without esophagitis: Secondary | ICD-10-CM | POA: Diagnosis not present

## 2016-11-04 DIAGNOSIS — Z6841 Body Mass Index (BMI) 40.0 and over, adult: Secondary | ICD-10-CM | POA: Diagnosis not present

## 2016-11-04 DIAGNOSIS — R1011 Right upper quadrant pain: Secondary | ICD-10-CM | POA: Diagnosis not present

## 2016-11-07 DIAGNOSIS — R1011 Right upper quadrant pain: Secondary | ICD-10-CM | POA: Diagnosis not present

## 2016-11-18 ENCOUNTER — Emergency Department (HOSPITAL_COMMUNITY): Payer: Medicare HMO

## 2016-11-18 ENCOUNTER — Encounter (HOSPITAL_COMMUNITY): Payer: Self-pay

## 2016-11-18 ENCOUNTER — Emergency Department (HOSPITAL_COMMUNITY)
Admission: EM | Admit: 2016-11-18 | Discharge: 2016-11-18 | Disposition: A | Discharge status: Home / Self Care | Payer: Medicare HMO | Attending: Emergency Medicine | Admitting: Emergency Medicine

## 2016-11-18 DIAGNOSIS — Z87891 Personal history of nicotine dependence: Secondary | ICD-10-CM | POA: Insufficient documentation

## 2016-11-18 DIAGNOSIS — R1011 Right upper quadrant pain: Secondary | ICD-10-CM | POA: Insufficient documentation

## 2016-11-18 DIAGNOSIS — Z79899 Other long term (current) drug therapy: Secondary | ICD-10-CM | POA: Diagnosis not present

## 2016-11-18 DIAGNOSIS — K760 Fatty (change of) liver, not elsewhere classified: Secondary | ICD-10-CM | POA: Diagnosis not present

## 2016-11-18 DIAGNOSIS — Z7982 Long term (current) use of aspirin: Secondary | ICD-10-CM | POA: Insufficient documentation

## 2016-11-18 LAB — COMPREHENSIVE METABOLIC PANEL
ALK PHOS: 52 U/L (ref 38–126)
ALT: 25 U/L (ref 14–54)
ANION GAP: 8 (ref 5–15)
AST: 33 U/L (ref 15–41)
Albumin: 3.5 g/dL (ref 3.5–5.0)
BILIRUBIN TOTAL: 0.5 mg/dL (ref 0.3–1.2)
BUN: 10 mg/dL (ref 6–20)
CALCIUM: 8.9 mg/dL (ref 8.9–10.3)
CO2: 26 mmol/L (ref 22–32)
Chloride: 104 mmol/L (ref 101–111)
Creatinine, Ser: 1.11 mg/dL — ABNORMAL HIGH (ref 0.44–1.00)
GFR, EST AFRICAN AMERICAN: 54 mL/min — AB (ref >60)
GFR, EST NON AFRICAN AMERICAN: 47 mL/min — AB (ref >60)
Glucose, Bld: 97 mg/dL (ref 65–99)
POTASSIUM: 4 mmol/L (ref 3.5–5.1)
Sodium: 138 mmol/L (ref 135–145)
TOTAL PROTEIN: 6.2 g/dL — AB (ref 6.5–8.1)

## 2016-11-18 LAB — URINALYSIS, ROUTINE W REFLEX MICROSCOPIC
Bilirubin Urine: NEGATIVE
GLUCOSE, UA: NEGATIVE mg/dL
Hgb urine dipstick: NEGATIVE
KETONES UR: NEGATIVE mg/dL
NITRITE: NEGATIVE
Protein, ur: NEGATIVE mg/dL
SPECIFIC GRAVITY, URINE: 1.024 (ref 1.005–1.030)
pH: 5 (ref 5.0–8.0)

## 2016-11-18 LAB — CBC
HEMATOCRIT: 39 % (ref 36.0–46.0)
HEMOGLOBIN: 12.5 g/dL (ref 12.0–15.0)
MCH: 28.7 pg (ref 26.0–34.0)
MCHC: 32.1 g/dL (ref 30.0–36.0)
MCV: 89.4 fL (ref 78.0–100.0)
Platelets: 250 10*3/uL (ref 150–400)
RBC: 4.36 MIL/uL (ref 3.87–5.11)
RDW: 14.1 % (ref 11.5–15.5)
WBC: 7.3 10*3/uL (ref 4.0–10.5)

## 2016-11-18 LAB — LIPASE, BLOOD: Lipase: 17 U/L (ref 11–51)

## 2016-11-18 MED ORDER — HYDROCODONE-ACETAMINOPHEN 5-325 MG PO TABS: 1.0000 | ORAL_TABLET | Freq: Four times a day (QID) | ORAL | 0 refills | Status: DC | PRN

## 2016-11-18 MED ORDER — IOPAMIDOL (ISOVUE-300) INJECTION 61%
INTRAVENOUS | Status: AC
  Administered 2016-11-18: 100 mL
  Filled 2016-11-18: qty 100

## 2016-11-18 MED ORDER — PANTOPRAZOLE SODIUM 40 MG PO TBEC
40.0000 mg | DELAYED_RELEASE_TABLET | Freq: Once | ORAL | Status: AC
  Administered 2016-11-18: 40 mg via ORAL
  Filled 2016-11-18: qty 1

## 2016-11-18 MED ORDER — HYDROCODONE-ACETAMINOPHEN 5-325 MG PO TABS
1.0000 | ORAL_TABLET | Freq: Once | ORAL | Status: AC
  Administered 2016-11-18: 1 via ORAL
  Filled 2016-11-18: qty 1

## 2016-11-18 NOTE — ED Notes (Signed)
Pt returned from CT °

## 2016-11-18 NOTE — ED Triage Notes (Signed)
Pt reports right sided abdominal pain that radiates to right flank area.

## 2016-11-18 NOTE — ED Notes (Signed)
Pt transported to CT ?

## 2016-11-18 NOTE — ED Provider Notes (Signed)
**Note DeAlisonIdentified via Obfuscation** [facility] DEPT Provider Note   CSN: [id number] Arrival date & time: 11/18/16  1532     History   Chief Complaint Chief Complaint  Patient presents with  . Abdominal Pain    HPI Alison York is a 76 y.o. female.  The history is provided by the patient and a relative.  Abdominal Pain   This is a new problem. The current episode started more than 1 week ago. The problem occurs daily. The problem has been gradually worsening. The pain is associated with an unknown factor. The pain is located in the RUQ. The quality of the pain is burning and sharp. The pain is severe. Associated symptoms include nausea. Pertinent negatives include anorexia, fever, diarrhea, flatus, hematochezia, melena, vomiting, constipation, dysuria, frequency, hematuria, headaches, arthralgias and myalgias. The symptoms are aggravated by palpation and deep breathing. Nothing relieves the symptoms. Past workup includes ultrasound. Her past medical history is significant for GERD.     PMH GERD presents for four weeks intermittent worsening RUQ pain that radiates around from her back. Pt states pain starts in her right thoracic back and wraps around to epigastric area. Feels like sharp and burning needles. Pain comes on randomly but is also worse with palpation. Patient had previous pain on the right side like this over a year ago. She has never had shingles. No rash. PCP ordered abdominal ultrasound which reportedly showed sludge in the gallbladder but was otherwise negative.   Past Medical History:  Diagnosis Date  . Anxiety state, unspecified   . Cerebral hemorrhage ([place]) 1995  . Dysphagia   . Erosive esophagitis   . Esophageal reflux   . Esophageal stricture   . Hiatal hernia   . Hypercholesterolemia   . Osteoporosis   . Personal history of colonic polyps 1999 & 11/04/2011   villous adenoma & tubular adenoma  . Prediabetes   . Restless legs syndrome (RLS)   . Vitamin D deficiency     Patient Active  Problem List   Diagnosis Date Noted  . Esophageal reflux 11/04/2011  . Personal history of colonic polyps 11/04/2011  . Benign neoplasm of colon 11/04/2011  . Dysphagia 10/25/2011  . History of colon polyps 10/25/2011  . GERD (gastroesophageal reflux disease) 10/25/2011  . Obesity, Class III, BMI 40Alison49.9 (morbid obesity) ([place]) 10/25/2011    Past Surgical History:  Procedure Laterality Date  . BREAST BIOPSY     bilateral  . CHONDROPLASTY  11/05/2010  . KNEE ARTHROSCOPY W/ MENISCECTOMY  11/05/2010  . PARTIAL HYSTERECTOMY  age 21Alison35  . TONSILLECTOMY      OB History    No data available       Home Medications    Prior to Admission medications   Medication Sig Start Date End Date Taking? Authorizing Provider  alendronate (FOSAMAX) 70 MG tablet Take 70 mg by mouth every Thursday. Take with a full glass of water on an empty stomach....(ON HOLD)   Yes Historical Provider, Alison  ALPRAZolam (XANAX) 0.5 MG tablet Take 0.5 mg by mouth 3 (three) times daily as needed.   Yes Historical Provider, Alison  aspirin 81 MG tablet Take 81 mg by mouth daily.   Yes Historical Provider, Alison  CalciumAlisonVitamin D 600Alison125 MGAlisonUNIT TABS Take 1 tablet by mouth daily.   Yes Historical Provider, Alison  Multiple Vitamin (MULTIVITAMIN) capsule Take 1 capsule by mouth daily.   Yes Historical Provider, Alison  omeprazole (PRILOSEC) 40 MG capsule Take 40 mg by mouth daily. 11/05/16  Yes Historical Provider,  Alison  PARoxetine (PAXIL) 20 MG tablet Take 20 mg by mouth every morning.   Yes Historical Provider, Alison  vitamin BAlison12 (CYANOCOBALAMIN) 1000 MCG tablet Take 1,500 mcg by mouth daily.   Yes Historical Provider, Alison  HYDROcodoneAlisonacetaminophen (NORCO) 5Alison325 MG tablet Take 1 tablet by mouth every 6 (six) hours as needed for moderate pain. 11/18/16   Monico Blitz, Alison    Family History Family History  Problem Relation Age of Onset  . Hypertension Father   . Tuberculosis Mother   . Breast cancer Sister   . Mental  illness Sister   . Breast cancer Daughter   . Colon cancer Neg Hx     Social History Social History  Substance Use Topics  . Smoking status: Former Smoker    Quit date: 08/26/1972  . Smokeless tobacco: Never Used  . Alcohol use No     Allergies   Simvastatin   Review of Systems Review of Systems  Constitutional: Negative for fatigue and fever.  Respiratory: Negative for shortness of breath.   Cardiovascular: Negative for chest pain and palpitations.  Gastrointestinal: Positive for abdominal pain and nausea. Negative for anorexia, constipation, diarrhea, flatus, hematochezia, melena and vomiting.  Genitourinary: Positive for flank pain. Negative for dysuria, frequency and hematuria.  Musculoskeletal: Negative for arthralgias and myalgias.  Skin: Negative for rash and wound.  Neurological: Negative for lightAlisonheadedness and headaches.  All other systems reviewed and are negative.    Physical Exam Updated Vital Signs BP (!) 109/48   Pulse 64   Temp 97.9 F (36.6 C) (Oral)   Resp 17   Ht 5' 4.5" (1.638 m)   Wt 113.4 kg   SpO2 98%   BMI 42.25 kg/m   Physical Exam  Constitutional: She appears wellAlisondeveloped and wellAlisonnourished. No distress.  HENT:  Head: Normocephalic and atraumatic.  Eyes: Conjunctivae and EOM are normal.  Neck: Normal range of motion. Neck supple.  Cardiovascular: Normal rate and regular rhythm.   No murmur heard. Pulmonary/Chest: Effort normal and breath sounds normal. No respiratory distress.  Abdominal: Soft. She exhibits no distension. There is tenderness.  Dermatomal distribution of TTP originating in paraspinal musculature and extending to epigastric area. No overlying skin changes  Musculoskeletal: She exhibits no edema.  Neurological: She is alert.  Skin: Skin is warm and dry.  Psychiatric: She has a normal mood and affect.  Nursing note and vitals reviewed.    ED Treatments / Results  Labs (all labs ordered are listed, but only  abnormal results are displayed) Labs Reviewed  COMPREHENSIVE METABOLIC PANEL - Abnormal; Notable for the following:       Result Value   Creatinine, Ser 1.11 (*)    Total Protein 6.2 (*)    GFR calc non Af Amer 47 (*)    GFR calc Af Amer 54 (*)    All other components within normal limits  URINALYSIS, ROUTINE W REFLEX MICROSCOPIC - Abnormal; Notable for the following:    APPearance HAZY (*)    Leukocytes, UA LARGE (*)    Bacteria, UA RARE (*)    Squamous Epithelial / LPF 0Alison5 (*)    All other components within normal limits  LIPASE, BLOOD  CBC    EKG  EKG Interpretation None       Radiology Ct Abdomen Pelvis W Contrast  Result Date: 11/18/2016 CLINICAL DATA:  Right abdominal and flank pain for 4 weeks. EXAM: CT ABDOMEN AND PELVIS WITH CONTRAST TECHNIQUE: Multidetector CT imaging of the abdomen and  pelvis was performed using the standard protocol following bolus administration of intravenous contrast. CONTRAST:  146mL ISOVUEAlison300 IOPAMIDOL (ISOVUEAlison300) INJECTION 61% COMPARISON:  None. FINDINGS: Lower Chest: No acute findings. Hepatobiliary: No masses identified. Mild hepatic steatosis. Gallbladder is unremarkable. Pancreas:  No mass or inflammatory changes. Spleen: Within normal limits in size and appearance. Adrenals/Urinary Tract: No masses identified. Small cyst noted in lower pole of left kidney. No evidence of hydronephrosis. Unopacified urinary bladder is unremarkable in appearance. Stomach/Bowel: Small to moderate hiatal hernia. No evidence of obstruction, inflammatory process or abnormal fluid collections. Normal appendix visualized. Vascular/Lymphatic: No pathologically enlarged lymph nodes. No abdominal aortic aneurysm. Aortic atherosclerosis. Reproductive: Prior hysterectomy noted. Adnexal regions are unremarkable in appearance. Other:  None. Musculoskeletal:  No suspicious bone lesions identified. IMPRESSION: No acute findings within the abdomen or pelvis. Small moderate hiatal  hernia. Mild hepatic steatosis. Aortic atherosclerosis. Electronically Signed   By: Earle Gell M.D.   On: 11/18/2016 21:44    Procedures Procedures (including critical care time)  Medications Ordered in ED Medications  HYDROcodoneAlisonacetaminophen (NORCO/VICODIN) 5Alison325 MG per tablet 1 tablet (1 tablet Oral Given 11/18/16 1920)  pantoprazole (PROTONIX) EC tablet 40 mg (40 mg Oral Given 11/18/16 1920)  iopamidol (ISOVUEAlison300) 61 % injection (100 mLs  Contrast Given 11/18/16 2110)     Initial Impression / Assessment and Plan / ED Course  I have reviewed the triage vital signs and the nursing notes.  Pertinent labs & imaging results that were available during my care of the patient were reviewed by me and considered in my medical decision making (see chart for details).    76 y.o. female presents for right flank and RUQ pain in dermatomal distribution. VSS, NAD. Diffuse tenderness on exam, no overlying skin changes. No hx of back pain or herniated disc. Given PO pain medication. UA, lipase, CMP, CBC unremarkable. CT abdomen unremarkable. No clear etiology of pain, possible atypical presentation shingles. Feel appropriate for outAlisonpatient management. Given short Rx pain medication and advised f/u with PCP. Return precautions given. Pt voiced understanding and agreement with plan.   Final Clinical Impressions(s) / ED Diagnoses   Final diagnoses:  Right upper quadrant abdominal pain    New Prescriptions Discharge Medication List as of 11/18/2016 11:15 PM    START taking these medications   Details  HYDROcodoneAlisonacetaminophen (NORCO) 5Alison325 MG tablet Take 1 tablet by mouth every 6 (six) hours as needed for moderate pain., Starting Mon 11/18/2016, Print         Monico Blitz, Alison 11/19/16 0149    Carmin Muskrat, Alison 11/19/16 1009

## 2016-11-18 NOTE — ED Notes (Signed)
Called CT to get update, states pt is next. Family and pt made aware.

## 2016-11-19 DIAGNOSIS — R1011 Right upper quadrant pain: Secondary | ICD-10-CM | POA: Diagnosis not present

## 2016-11-19 DIAGNOSIS — K219 Gastro-esophageal reflux disease without esophagitis: Secondary | ICD-10-CM | POA: Diagnosis not present

## 2016-11-19 DIAGNOSIS — M546 Pain in thoracic spine: Secondary | ICD-10-CM | POA: Diagnosis not present

## 2016-11-19 DIAGNOSIS — R932 Abnormal findings on diagnostic imaging of liver and biliary tract: Secondary | ICD-10-CM | POA: Diagnosis not present

## 2016-11-19 DIAGNOSIS — Z6841 Body Mass Index (BMI) 40.0 and over, adult: Secondary | ICD-10-CM | POA: Diagnosis not present

## 2016-11-29 ENCOUNTER — Encounter: Payer: Self-pay | Admitting: Nurse Practitioner

## 2016-12-02 DIAGNOSIS — E538 Deficiency of other specified B group vitamins: Secondary | ICD-10-CM | POA: Diagnosis not present

## 2016-12-02 DIAGNOSIS — M810 Age-related osteoporosis without current pathological fracture: Secondary | ICD-10-CM | POA: Diagnosis not present

## 2016-12-02 DIAGNOSIS — R7303 Prediabetes: Secondary | ICD-10-CM | POA: Diagnosis not present

## 2016-12-02 DIAGNOSIS — F419 Anxiety disorder, unspecified: Secondary | ICD-10-CM | POA: Diagnosis not present

## 2016-12-02 DIAGNOSIS — Z79899 Other long term (current) drug therapy: Secondary | ICD-10-CM | POA: Diagnosis not present

## 2016-12-02 DIAGNOSIS — E7800 Pure hypercholesterolemia, unspecified: Secondary | ICD-10-CM | POA: Diagnosis not present

## 2016-12-02 DIAGNOSIS — E559 Vitamin D deficiency, unspecified: Secondary | ICD-10-CM | POA: Diagnosis not present

## 2016-12-02 DIAGNOSIS — R1011 Right upper quadrant pain: Secondary | ICD-10-CM | POA: Diagnosis not present

## 2016-12-02 DIAGNOSIS — N183 Chronic kidney disease, stage 3 (moderate): Secondary | ICD-10-CM | POA: Diagnosis not present

## 2016-12-10 ENCOUNTER — Ambulatory Visit: Payer: Medicare PPO | Admitting: Nurse Practitioner

## 2016-12-23 ENCOUNTER — Ambulatory Visit (INDEPENDENT_AMBULATORY_CARE_PROVIDER_SITE_OTHER): Payer: Medicare HMO | Admitting: Nurse Practitioner

## 2016-12-23 ENCOUNTER — Encounter (INDEPENDENT_AMBULATORY_CARE_PROVIDER_SITE_OTHER): Payer: Self-pay

## 2016-12-23 ENCOUNTER — Encounter: Payer: Self-pay | Admitting: Nurse Practitioner

## 2016-12-23 VITALS — BP 120/70 | HR 75 | Ht 65.0 in | Wt 252.0 lb

## 2016-12-23 DIAGNOSIS — K219 Gastro-esophageal reflux disease without esophagitis: Secondary | ICD-10-CM | POA: Diagnosis not present

## 2016-12-23 DIAGNOSIS — R1010 Upper abdominal pain, unspecified: Secondary | ICD-10-CM

## 2016-12-23 DIAGNOSIS — R1310 Dysphagia, unspecified: Secondary | ICD-10-CM | POA: Diagnosis not present

## 2016-12-23 DIAGNOSIS — Z86010 Personal history of colonic polyps: Secondary | ICD-10-CM | POA: Diagnosis not present

## 2016-12-23 DIAGNOSIS — K449 Diaphragmatic hernia without obstruction or gangrene: Secondary | ICD-10-CM | POA: Diagnosis not present

## 2016-12-23 MED ORDER — NA SULFATE-K SULFATE-MG SULF 17.5-3.13-1.6 GM/177ML PO SOLN: 1.0000 | Freq: Once | ORAL | 0 refills | Status: AC

## 2016-12-23 NOTE — Patient Instructions (Signed)
If you are age 76 or older, your body mass index should be between 23-30. Your Body mass index is 41.93 kg/m. If this is out of the aforementioned range listed, please consider follow up with your Primary Care Provider.  If you are age 63 or younger, your body mass index should be between 19-25. Your Body mass index is 41.93 kg/m. If this is out of the aformentioned range listed, please consider follow up with your Primary Care Provider.   You have been scheduled for an endoscopy and colonoscopy. Please follow the written instructions given to you at your visit today. Please pick up your prep supplies at the pharmacy within the next 1-3 days. If you use inhalers (even only as needed), please bring them with you on the day of your procedure. Your physician has requested that you go to www.startemmi.com and enter the access code given to you at your visit today. This web site gives a general overview about your procedure. However, you should still follow specific instructions given to you by our office regarding your preparation for the procedure.  You may have a light breakfast the morning of prep day (the day before the procedure).   You may choose from one of the following items: eggs and toast or chicken noodle soup and crackers.   You should have your breakfast completed between 8:00 and 9:00 am the day before your procedure.  After you have had your light breakfast you should start a clear liquid dietonly NO SOLIDS. No additional solid food is allowed. You may continue to have clear liquid up to 3 hours prior to your procedure.    Thank you for choosing me and [facility].  Tye Savoy, NP

## 2016-12-23 NOTE — Progress Notes (Addendum)
HPI:  Patient is a 76 year old female previously known to Dr. Sharlett Iles, last seen April 2013. She has a history of GERD and a large hiatal hernia. Patient is referred by PCP Cyndi Bender, PA for abdominal pain. Several weeks ago patient developed acute pain under her right shoulder blade. Over the following weeks the pain wrapped around to the front of her abdomen to the epigastrium. Pain described as stinging sensation with numbness over the right upper quadrant. Abd ultrasound was unremarkable except for fatty liver. It sounds like shingles was initially on the DDx list but she never did have any skin eruptions. The discomfort is not related to eating nor movement. Patients wonders if hiatal hernia has anything to do with the pain. A month ago while laying on left side patient felt something "pop". The then felt sensation of warm fluid running through her upper abdomen  Patient has a history of GERD, she has breakthrough symptoms on omeprazole. Nexium worked better but insurance stopped paying for years ago. She has frequent regurgitation of food, especially with a full meal. She has occasional solid food dysphagia. Esophagus was empirically dilated in March 2013.  Patient received a colonoscopy recall letter, she had a cecal adenoma without high grade dysplasia removed in 2013.   Past Medical History:  Diagnosis Date  . Anxiety state, unspecified   . Cerebral hemorrhage ([place]) 1995  . Dysphagia   . Erosive esophagitis   . Esophageal reflux   . Esophageal stricture   . Hiatal hernia   . Hypercholesterolemia   . Osteoporosis   . Personal history of colonic polyps 1999 & 11/04/2011   villous adenoma & tubular adenoma  . Prediabetes   . Restless legs syndrome (RLS)   . Vitamin D deficiency      Past Surgical History:  Procedure Laterality Date  . BREAST BIOPSY     bilateral  . CHONDROPLASTY  11/05/2010  . KNEE ARTHROSCOPY W/ MENISCECTOMY  11/05/2010  . PARTIAL HYSTERECTOMY   age [age]  . TONSILLECTOMY     Family History  Problem Relation Age of Onset  . Hypertension Father   . Tuberculosis Mother   . Breast cancer Sister   . Mental illness Sister   . Breast cancer Daughter   . Colon cancer Neg Hx    Social History  Substance Use Topics  . Smoking status: Former Smoker    Quit date: 08/26/1972  . Smokeless tobacco: Never Used  . Alcohol use No   Current Outpatient Prescriptions  Medication Sig Dispense Refill  . alendronate (FOSAMAX) 70 MG tablet Take 70 mg by mouth every Thursday. Take with a full glass of water on an empty stomach....(ON HOLD)    . ALPRAZolam (XANAX) 0.5 MG tablet Take 0.5 mg by mouth 3 (three) times daily as needed.    Marland Kitchen aspirin 81 MG tablet Take 81 mg by mouth daily.    . Calcium-Vitamin D 600-125 MG-UNIT TABS Take 1 tablet by mouth daily.    . Multiple Vitamin (MULTIVITAMIN) capsule Take 1 capsule by mouth daily.    Marland Kitchen omeprazole (PRILOSEC) 40 MG capsule Take 40 mg by mouth daily.    Marland Kitchen PARoxetine (PAXIL) 20 MG tablet Take 20 mg by mouth every morning.    . vitamin B-12 (CYANOCOBALAMIN) 1000 MCG tablet Take 1,500 mcg by mouth daily.     No current facility-administered medications for this visit.    Allergies  Allergen Reactions  . Simvastatin     Leg  pain     Review of Systems: Positive for anxiety, back pain, fatigue, shortness of breath. All other systems reviewed and negative except where noted in HPI.    Physical Exam: BP 120/70   Pulse 75   Ht 5\' 5"  (1.651 m)   Wt 252 lb (114.3 kg)   BMI 41.93 kg/m  Constitutional:  Well-developed female in no acute distress. Psychiatric: Normal mood and affect. Behavior is normal. EENT: . Conjunctivae are normal. No scleral icterus. Neck supple.  Cardiovascular: Normal rate, regular rhythm.  Pulmonary/chest: Effort normal and breath sounds normal. No wheezing, rales or rhonchi. Abdominal: Soft, nondistended, nontender. Bowel sounds active throughout. There are no masses  palpable. No hepatomegaly. Extremities: no edema Lymphadenopathy: No cervical adenopathy noted. Neurological: Alert and oriented to person place and time. Skin: Skin is warm and dry. No rashes noted.   ASSESSMENT AND PLAN:  1. 76 yo female with several week history of stinging pain under right shoulder blade radiating around to RUQ where there is also numbness. No acute findings on RUQ ultrasound. Initially felt to be beginning of shingles but didn't develop the lesions Pain is unrelated to eating or activity. No associated nausea. Pain does not sound GI in nature.  2. GERD / large hiatal hernia on last EGD in 2013. She is quite symptomatic with frequent regurgitation of food. She has occasional solid food dysphagia, empirical dilation in 2013 was helpful.  -For further evaluation of refractory GERD symptoms as well as recurrent dysphagia patient will be scheduled for EGD with possible dilation. The risks and benefits of EGD were discussed and the patient agrees to proceed.  -Hiatal hernia can be reassessed at time of EGD. She may need surgical referral  3. History of tubular adenomatous colon polyps without high grade dysplasia in 2013. Patient received a recall colonoscopy letter from Dr. Hilarie Fredrickson.  -Patient will be scheduled for a surveillance colonoscopy with possible polypectomy.  The risks and benefits of the procedure were discussed and the patient agrees to proceed.    Tye Savoy, NP  12/23/2016, 2:38 PM   Cyndi Bender, PA-C  Addendum: Reviewed and agree with initial management. Jerene Bears, MD

## 2017-01-15 ENCOUNTER — Encounter: Payer: Self-pay | Admitting: Internal Medicine

## 2017-01-23 DIAGNOSIS — M546 Pain in thoracic spine: Secondary | ICD-10-CM | POA: Diagnosis not present

## 2017-01-29 ENCOUNTER — Encounter: Payer: Self-pay | Admitting: Internal Medicine

## 2017-01-29 ENCOUNTER — Ambulatory Visit (AMBULATORY_SURGERY_CENTER): Payer: Medicare HMO | Admitting: Internal Medicine

## 2017-01-29 VITALS — BP 118/50 | HR 62 | Temp 98.6°F | Resp 17 | Ht 65.0 in | Wt 252.0 lb

## 2017-01-29 DIAGNOSIS — D125 Benign neoplasm of sigmoid colon: Secondary | ICD-10-CM | POA: Diagnosis not present

## 2017-01-29 DIAGNOSIS — D120 Benign neoplasm of cecum: Secondary | ICD-10-CM | POA: Diagnosis not present

## 2017-01-29 DIAGNOSIS — R1319 Other dysphagia: Secondary | ICD-10-CM

## 2017-01-29 DIAGNOSIS — K635 Polyp of colon: Secondary | ICD-10-CM

## 2017-01-29 DIAGNOSIS — Z860100 Personal history of colon polyps, unspecified: Secondary | ICD-10-CM

## 2017-01-29 DIAGNOSIS — D123 Benign neoplasm of transverse colon: Secondary | ICD-10-CM | POA: Diagnosis not present

## 2017-01-29 DIAGNOSIS — D122 Benign neoplasm of ascending colon: Secondary | ICD-10-CM

## 2017-01-29 DIAGNOSIS — K449 Diaphragmatic hernia without obstruction or gangrene: Secondary | ICD-10-CM | POA: Diagnosis not present

## 2017-01-29 DIAGNOSIS — R1310 Dysphagia, unspecified: Secondary | ICD-10-CM

## 2017-01-29 DIAGNOSIS — D124 Benign neoplasm of descending colon: Secondary | ICD-10-CM | POA: Diagnosis not present

## 2017-01-29 DIAGNOSIS — K219 Gastro-esophageal reflux disease without esophagitis: Secondary | ICD-10-CM

## 2017-01-29 DIAGNOSIS — K621 Rectal polyp: Secondary | ICD-10-CM | POA: Diagnosis not present

## 2017-01-29 MED ORDER — SODIUM CHLORIDE 0.9 % IV SOLN: 500.0000 mL | INTRAVENOUS | Status: DC

## 2017-01-29 MED ORDER — RANITIDINE HCL 150 MG PO TABS: ORAL_TABLET | ORAL | 3 refills | Status: DC

## 2017-01-29 NOTE — Op Note (Signed)
[place] [facility] Patient Name: Alison York Procedure Date: 01/29/2017 3:25 PM MRN: [id number] Endoscopist: Jerene Bears , MD Age: 76 Referring MD:  Date of Birth: May 25, 1941 Gender: Female Account #: [id number] Procedure:                Upper GI endoscopy Indications:              Abdominal pain in the right upper quadrant,                            Dysphagia (today patient states this is not a                            problem but was in the past before dilation by Dr.                            Sharlett Iles), Gastro-esophageal reflux disease Medicines:                Monitored Anesthesia Care Procedure:                Pre-Anesthesia Assessment:                           - Prior to the procedure, a History and Physical                            was performed, and patient medications and                            allergies were reviewed. The patient's tolerance of                            previous anesthesia was also reviewed. The risks                            and benefits of the procedure and the sedation                            options and risks were discussed with the patient.                            All questions were answered, and informed consent                            was obtained. Prior Anticoagulants: The patient has                            taken no previous anticoagulant or antiplatelet                            agents. ASA Grade Assessment: III - A patient with                            severe systemic disease. After reviewing the risks  and benefits, the patient was deemed in                            satisfactory condition to undergo the procedure.                           After obtaining informed consent, the endoscope was                            passed under direct vision. Throughout the                            procedure, the patient's blood pressure, pulse, and                            oxygen saturations were  monitored continuously. The                            Endoscope was introduced through the mouth, and                            advanced to the second part of duodenum. The upper                            GI endoscopy was accomplished without difficulty.                            The patient tolerated the procedure well. Scope In: Scope Out: Findings:                 Normal mucosa was found in the entire esophagus.                           A large hiatal hernia was found. The proximal                            extent of the gastric folds (end of tubular                            esophagus) was 31 cm from the incisors. The hiatal                            narrowing was 38 cm from the incisors.                           A single localized, small non-bleeding erosion was                            found in the cardia.                           The exam of the stomach was otherwise normal.  The examined duodenum was normal. Complications:            No immediate complications. Estimated Blood Loss:     Estimated blood loss: none. Impression:               - Normal mucosa was found in the entire esophagus.                           - Large hiatal hernia.                           - Non-bleeding erosive gastropathy related to                            hiatal hernia.                           - Normal examined duodenum.                           - No specimens collected. Recommendation:           - Patient has a contact number available for                            emergencies. The signs and symptoms of potential                            delayed complications were discussed with the                            patient. Return to normal activities tomorrow.                            Written discharge instructions were provided to the                            patient.                           - Resume previous diet.                           - Continue  present medications. Add ranitidine 150                            mg each evening. Jerene Bears, MD 01/29/2017 4:57:23 PM This report has been signed electronically.

## 2017-01-29 NOTE — Progress Notes (Signed)
Called to room to assist during endoscopic procedure.  Patient ID and intended procedure confirmed with present staff. Received instructions for my participation in the procedure from the performing physician.  

## 2017-01-29 NOTE — Patient Instructions (Addendum)
YOU HAD AN ENDOSCOPIC PROCEDURE TODAY AT [facility]:   Refer to the procedure report that was given to you for any specific questions about what was found during the examination.  If the procedure report does not answer your questions, please call your gastroenterologist to clarify.  If you requested that your care partner not be given the details of your procedure findings, then the procedure report has been included in a sealed envelope for you to review at your convenience later.  YOU SHOULD EXPECT: Some feelings of bloating in the abdomen. Passage of more gas than usual.  Walking can help get rid of the air that was put into your GI tract during the procedure and reduce the bloating. If you had a lower endoscopy (such as a colonoscopy or flexible sigmoidoscopy) you may notice spotting of blood in your stool or on the toilet paper. If you underwent a bowel prep for your procedure, you may not have a normal bowel movement for a few days.  Please Note:  You might notice some irritation and congestion in your nose or some drainage.  This is from the oxygen used during your procedure.  There is no need for concern and it should clear up in a day or so.  SYMPTOMS TO REPORT IMMEDIATELY:   Following lower endoscopy (colonoscopy or flexible sigmoidoscopy):  Excessive amounts of blood in the stool  Significant tenderness or worsening of abdominal pains  Swelling of the abdomen that is new, acute  Fever of 100F or higher   Following upper endoscopy (EGD)  Vomiting of blood or coffee ground material  New chest pain or pain under the shoulder blades  Painful or persistently difficult swallowing  New shortness of breath  Fever of 100F or higher  Black, tarry-looking stools  For urgent or emergent issues, a gastroenterologist can be reached at any hour by calling [phone].   DIET:  We do recommend a small meal at first, but then you may proceed to your regular diet.  Drink  plenty of fluids but you should avoid alcoholic beverages for 24 hours.  ACTIVITY:  You should plan to take it easy for the rest of today and you should NOT DRIVE or use heavy machinery until tomorrow (because of the sedation medicines used during the test).    FOLLOW UP: Our staff will call the number listed on your records the next business day following your procedure to check on you and address any questions or concerns that you may have regarding the information given to you following your procedure. If we do not reach you, we will leave a message.  However, if you are feeling well and you are not experiencing any problems, there is no need to return our call.  We will assume that you have returned to your regular daily activities without incident.  If any biopsies were taken you will be contacted by phone or by letter within the next 1-3 weeks.  Please call us at [phone] if you have not heard about the biopsies in 3 weeks.    SIGNATURES/CONFIDENTIALITY: You and/or your care partner have signed paperwork which will be entered into your electronic medical record.  These signatures attest to the fact that that the information above on your After Visit Summary has been reviewed and is understood.  Full responsibility of the confidentiality of this discharge information lies with you and/or your care-partner.   Resume medications. Information given on hiatal hernia and polyps. Follow  up in office in 2 months.

## 2017-01-29 NOTE — Op Note (Signed)
[facility] Patient Name: Alison York Procedure Date: 01/29/2017 3:25 PM MRN: [id number] Endoscopist: Jerene Bears , MD Age: 76 Referring MD:  Date of Birth: [birth year] Gender: Female Account #: [id number] Procedure:                Colonoscopy Indications:              Surveillance: Personal history of adenomatous                            polyps on last colonoscopy 5 years ago Medicines:                Monitored Anesthesia Care Procedure:                Pre-Anesthesia Assessment:                           - Prior to the procedure, a History and Physical                            was performed, and patient medications and                            allergies were reviewed. The patient's tolerance of                            previous anesthesia was also reviewed. The risks                            and benefits of the procedure and the sedation                            options and risks were discussed with the patient.                            All questions were answered, and informed consent                            was obtained. Prior Anticoagulants: The patient has                            taken no previous anticoagulant or antiplatelet                            agents. ASA Grade Assessment: III - A patient with                            severe systemic disease. After reviewing the risks                            and benefits, the patient was deemed in                            satisfactory condition to undergo the procedure.  After obtaining informed consent, the colonoscope                            was passed under direct vision. Throughout the                            procedure, the patient's blood pressure, pulse, and                            oxygen saturations were monitored continuously. The                            Model PCF-[id number]) scope was introduced                            through the anus and  advanced to the the cecum,                            identified by appendiceal orifice and ileocecal                            valve. The colonoscopy was performed without                            difficulty. The patient tolerated the procedure                            well. The quality of the bowel preparation was                            good. The ileocecal valve, appendiceal orifice, and                            rectum were photographed. Scope In: 3:55:08 PM Scope Out: 4:36:32 PM Scope Withdrawal Time: 0 hours 37 minutes 0 seconds  Total Procedure Duration: 0 hours 41 minutes 24 seconds  Findings:                 The digital rectal exam was normal.                           A 15 mm polyp was found in the cecum. The polyp was                            sessile. The polyp was removed with a piecemeal                            technique using a cold snare. Resection and                            retrieval were complete.                           Five sessile polyps were found in the ascending  colon. The polyps were 5 to 10 mm in size. These                            polyps were removed with a cold snare. Resection                            and retrieval were complete.                           A 2 mm polyp was found in the ascending colon. The                            polyp was sessile. The polyp was removed with a                            cold biopsy forceps. Resection and retrieval were                            complete.                           Five sessile polyps were found in the transverse                            colon. The polyps were 4 to 9 mm in size. These                            polyps were removed with a cold snare. Resection                            and retrieval were complete.                           A 2 mm polyp was found in the transverse colon. The                            polyp was sessile. The polyp was  removed with a                            cold biopsy forceps. Resection and retrieval were                            complete.                           Three sessile polyps were found in the descending                            colon. The polyps were 4 to 8 mm in size. These                            polyps were removed with a cold snare. Resection  and retrieval were complete.                           Five sessile polyps were found in the sigmoid                            colon. The polyps were 4 to 7 mm in size. These                            polyps were removed with a cold snare. Resection                            and retrieval were complete.                           Two sessile polyps were found in the rectum. The                            polyps were 4 to 5 mm in size. These polyps were                            removed with a cold snare. Resection and retrieval                            were complete.                           Internal hemorrhoids were found during                            retroflexion. The hemorrhoids were small. Complications:            No immediate complications. Estimated Blood Loss:     Estimated blood loss was minimal. Impression:               - One 15 mm polyp in the cecum, removed piecemeal                            using a cold snare. Resected and retrieved.                           - Five 5 to 10 mm polyps in the ascending colon,                            removed with a cold snare. Resected and retrieved.                           - One 2 mm polyp in the ascending colon, removed                            with a cold biopsy forceps. Resected and retrieved.                           - Five 4  to 9 mm polyps in the transverse colon,                            removed with a cold snare. Resected and retrieved.                           - One 2 mm polyp in the transverse colon, removed                             with a cold biopsy forceps. Resected and retrieved.                           - Three 4 to 8 mm polyps in the descending colon,                            removed with a cold snare. Resected and retrieved.                           - Five 4 to 7 mm polyps in the sigmoid colon,                            removed with a cold snare. Resected and retrieved.                           - Two 4 to 5 mm polyps in the rectum, removed with                            a cold snare. Resected and retrieved.                           - Internal hemorrhoids. Recommendation:           - Patient has a contact number available for                            emergencies. The signs and symptoms of potential                            delayed complications were discussed with the                            patient. Return to normal activities tomorrow.                            Written discharge instructions were provided to the                            patient.                           - Resume previous diet.                           - Continue present medications.                           -  Await pathology results.                           - Repeat colonoscopy is recommended for                            surveillance of multiple polyps. The colonoscopy                            date will be determined after pathology results                            from today's exam become available for review. Jerene Bears, MD 01/29/2017 5:02:46 PM This report has been signed electronically.

## 2017-01-29 NOTE — Progress Notes (Signed)
Pt's states no medical or surgical changes since previsit or office visit. 

## 2017-01-29 NOTE — Progress Notes (Signed)
Report to PACU, RN, vss, BBS= Clear.  

## 2017-01-30 ENCOUNTER — Telehealth: Payer: Self-pay

## 2017-01-30 ENCOUNTER — Telehealth: Payer: Self-pay | Admitting: *Deleted

## 2017-01-30 NOTE — Telephone Encounter (Signed)
No answer, message left for the patient. 

## 2017-01-30 NOTE — Telephone Encounter (Signed)
  Follow up Call-  Call back number 01/29/2017  Post procedure Call Back phone  # [phone]  Permission to leave phone message Yes  Some recent data might be hidden     Patient questions:  Do you have a fever, pain , or abdominal swelling? No. Pain Score  0 *  Have you tolerated food without any problems? Yes.    Have you been able to return to your normal activities? Yes.    Do you have any questions about your discharge instructions: Diet   No. Medications  No. Follow up visit  No.  Do you have questions or concerns about your Care? No.  Actions: * If pain score is 4 or above: No action needed, pain <4.

## 2017-01-31 DIAGNOSIS — M549 Dorsalgia, unspecified: Secondary | ICD-10-CM | POA: Diagnosis not present

## 2017-01-31 DIAGNOSIS — M546 Pain in thoracic spine: Secondary | ICD-10-CM | POA: Diagnosis not present

## 2017-02-06 ENCOUNTER — Telehealth: Payer: Self-pay | Admitting: Internal Medicine

## 2017-02-06 ENCOUNTER — Encounter: Payer: Self-pay | Admitting: Internal Medicine

## 2017-02-06 NOTE — Telephone Encounter (Signed)
Pt called and wanted to know if she could take zantac 150mg  in the am as well as at bedtime. Discussed with pt that she could that it was not an issue.

## 2017-02-14 ENCOUNTER — Telehealth: Payer: Self-pay | Admitting: Internal Medicine

## 2017-02-14 NOTE — Telephone Encounter (Signed)
Left message for pt to call back.  Discussed results letter with pt and she is aware.

## 2017-03-20 ENCOUNTER — Encounter: Payer: Self-pay | Admitting: *Deleted

## 2017-04-07 ENCOUNTER — Ambulatory Visit: Payer: Medicare HMO | Admitting: Internal Medicine

## 2017-04-07 NOTE — Progress Notes (Signed)
No show letter sent.

## 2017-04-08 DIAGNOSIS — Z9181 History of falling: Secondary | ICD-10-CM | POA: Diagnosis not present

## 2017-04-08 DIAGNOSIS — Z136 Encounter for screening for cardiovascular disorders: Secondary | ICD-10-CM | POA: Diagnosis not present

## 2017-04-08 DIAGNOSIS — Z1389 Encounter for screening for other disorder: Secondary | ICD-10-CM | POA: Diagnosis not present

## 2017-04-08 DIAGNOSIS — Z6841 Body Mass Index (BMI) 40.0 and over, adult: Secondary | ICD-10-CM | POA: Diagnosis not present

## 2017-04-08 DIAGNOSIS — Z0000 Encounter for general adult medical examination without abnormal findings: Secondary | ICD-10-CM | POA: Diagnosis not present

## 2017-04-08 DIAGNOSIS — E785 Hyperlipidemia, unspecified: Secondary | ICD-10-CM | POA: Diagnosis not present

## 2017-04-10 DIAGNOSIS — M25562 Pain in left knee: Secondary | ICD-10-CM | POA: Diagnosis not present

## 2017-04-23 DIAGNOSIS — Z6841 Body Mass Index (BMI) 40.0 and over, adult: Secondary | ICD-10-CM | POA: Diagnosis not present

## 2017-04-23 DIAGNOSIS — L729 Follicular cyst of the skin and subcutaneous tissue, unspecified: Secondary | ICD-10-CM | POA: Diagnosis not present

## 2017-04-23 DIAGNOSIS — R21 Rash and other nonspecific skin eruption: Secondary | ICD-10-CM | POA: Diagnosis not present

## 2017-05-08 DIAGNOSIS — M25562 Pain in left knee: Secondary | ICD-10-CM | POA: Diagnosis not present

## 2017-05-22 DIAGNOSIS — M1712 Unilateral primary osteoarthritis, left knee: Secondary | ICD-10-CM | POA: Diagnosis not present

## 2017-06-03 DIAGNOSIS — N183 Chronic kidney disease, stage 3 (moderate): Secondary | ICD-10-CM | POA: Diagnosis not present

## 2017-06-03 DIAGNOSIS — E7800 Pure hypercholesterolemia, unspecified: Secondary | ICD-10-CM | POA: Diagnosis not present

## 2017-06-04 DIAGNOSIS — K219 Gastro-esophageal reflux disease without esophagitis: Secondary | ICD-10-CM | POA: Diagnosis not present

## 2017-06-04 DIAGNOSIS — N183 Chronic kidney disease, stage 3 (moderate): Secondary | ICD-10-CM | POA: Diagnosis not present

## 2017-06-04 DIAGNOSIS — R7303 Prediabetes: Secondary | ICD-10-CM | POA: Diagnosis not present

## 2017-06-04 DIAGNOSIS — M810 Age-related osteoporosis without current pathological fracture: Secondary | ICD-10-CM | POA: Diagnosis not present

## 2017-06-04 DIAGNOSIS — E7800 Pure hypercholesterolemia, unspecified: Secondary | ICD-10-CM | POA: Diagnosis not present

## 2017-06-04 DIAGNOSIS — Z23 Encounter for immunization: Secondary | ICD-10-CM | POA: Diagnosis not present

## 2017-06-04 DIAGNOSIS — Z79899 Other long term (current) drug therapy: Secondary | ICD-10-CM | POA: Diagnosis not present

## 2017-06-04 DIAGNOSIS — F419 Anxiety disorder, unspecified: Secondary | ICD-10-CM | POA: Diagnosis not present

## 2017-06-04 DIAGNOSIS — Z6841 Body Mass Index (BMI) 40.0 and over, adult: Secondary | ICD-10-CM | POA: Diagnosis not present

## 2017-12-03 DIAGNOSIS — E7800 Pure hypercholesterolemia, unspecified: Secondary | ICD-10-CM | POA: Diagnosis not present

## 2017-12-03 DIAGNOSIS — E559 Vitamin D deficiency, unspecified: Secondary | ICD-10-CM | POA: Diagnosis not present

## 2017-12-03 DIAGNOSIS — N183 Chronic kidney disease, stage 3 (moderate): Secondary | ICD-10-CM | POA: Diagnosis not present

## 2017-12-03 DIAGNOSIS — E538 Deficiency of other specified B group vitamins: Secondary | ICD-10-CM | POA: Diagnosis not present

## 2017-12-05 DIAGNOSIS — R0602 Shortness of breath: Secondary | ICD-10-CM | POA: Diagnosis not present

## 2017-12-05 DIAGNOSIS — K219 Gastro-esophageal reflux disease without esophagitis: Secondary | ICD-10-CM | POA: Diagnosis not present

## 2017-12-05 DIAGNOSIS — G2581 Restless legs syndrome: Secondary | ICD-10-CM | POA: Diagnosis not present

## 2017-12-05 DIAGNOSIS — N183 Chronic kidney disease, stage 3 (moderate): Secondary | ICD-10-CM | POA: Diagnosis not present

## 2017-12-05 DIAGNOSIS — M810 Age-related osteoporosis without current pathological fracture: Secondary | ICD-10-CM | POA: Diagnosis not present

## 2017-12-05 DIAGNOSIS — Z79899 Other long term (current) drug therapy: Secondary | ICD-10-CM | POA: Diagnosis not present

## 2017-12-05 DIAGNOSIS — F419 Anxiety disorder, unspecified: Secondary | ICD-10-CM | POA: Diagnosis not present

## 2017-12-05 DIAGNOSIS — E7800 Pure hypercholesterolemia, unspecified: Secondary | ICD-10-CM | POA: Diagnosis not present

## 2017-12-05 DIAGNOSIS — Z1231 Encounter for screening mammogram for malignant neoplasm of breast: Secondary | ICD-10-CM | POA: Diagnosis not present

## 2017-12-11 ENCOUNTER — Telehealth: Payer: Self-pay

## 2017-12-11 NOTE — Telephone Encounter (Signed)
Sent referral to scheduling 

## 2017-12-17 DIAGNOSIS — Z1231 Encounter for screening mammogram for malignant neoplasm of breast: Secondary | ICD-10-CM | POA: Diagnosis not present

## 2017-12-17 DIAGNOSIS — Z803 Family history of malignant neoplasm of breast: Secondary | ICD-10-CM | POA: Diagnosis not present

## 2017-12-17 DIAGNOSIS — M8589 Other specified disorders of bone density and structure, multiple sites: Secondary | ICD-10-CM | POA: Diagnosis not present

## 2017-12-26 ENCOUNTER — Encounter: Payer: Self-pay | Admitting: Internal Medicine

## 2018-01-22 DIAGNOSIS — M5431 Sciatica, right side: Secondary | ICD-10-CM | POA: Diagnosis not present

## 2018-02-04 ENCOUNTER — Encounter: Payer: Self-pay | Admitting: Cardiology

## 2018-02-04 ENCOUNTER — Ambulatory Visit: Payer: Medicare HMO | Admitting: Cardiology

## 2018-02-04 VITALS — BP 130/70 | HR 71 | Ht 65.0 in | Wt 250.0 lb

## 2018-02-04 DIAGNOSIS — N183 Chronic kidney disease, stage 3 unspecified: Secondary | ICD-10-CM | POA: Insufficient documentation

## 2018-02-04 DIAGNOSIS — K219 Gastro-esophageal reflux disease without esophagitis: Secondary | ICD-10-CM

## 2018-02-04 DIAGNOSIS — E7800 Pure hypercholesterolemia, unspecified: Secondary | ICD-10-CM

## 2018-02-04 DIAGNOSIS — R0789 Other chest pain: Secondary | ICD-10-CM

## 2018-02-04 DIAGNOSIS — R0600 Dyspnea, unspecified: Secondary | ICD-10-CM | POA: Diagnosis not present

## 2018-02-04 DIAGNOSIS — R0609 Other forms of dyspnea: Secondary | ICD-10-CM | POA: Diagnosis not present

## 2018-02-04 NOTE — Patient Instructions (Addendum)
Medication Instructions: Your physician recommends that you continue on your current medications as directed. Please refer to the Current Medication list given to you today.   Labwork: TODAY: LIPIDS  Procedures/Testing: Your physician has requested that you have a lexiscan myoview. For further information please visit [email or web address]. Please follow instruction sheet, as given.  Your physician has requested that you have an echocardiogram. Echocardiography is a painless test that uses sound waves to create images of your heart. It provides your doctor with information about the size and shape of your heart and how well your heart's chambers and valves are working. This procedure takes approximately one hour. There are no restrictions for this procedure.    Follow-Up: Your physician recommends that you schedule a follow-up appointment in: 1 month with Daune Perch NP   Any Additional Special Instructions Will Be Listed Below (If Applicable).   DASH Eating Plan DASH stands for "Dietary Approaches to Stop Hypertension." The DASH eating plan is a healthy eating plan that has been shown to reduce high blood pressure (hypertension). It may also reduce your risk for type 2 diabetes, heart disease, and stroke. The DASH eating plan may also help with weight loss. What are tips for following this plan? General guidelines  Avoid eating more than 2,300 mg (milligrams) of salt (sodium) a day. If you have hypertension, you may need to reduce your sodium intake to 1,500 mg a day.  Limit alcohol intake to no more than 1 drink a day for nonpregnant women and 2 drinks a day for men. One drink equals 12 oz of beer, 5 oz of wine, or 1 oz of hard liquor.  Work with your health care provider to maintain a healthy body weight or to lose weight. Ask what an ideal weight is for you.  Get at least 30 minutes of exercise that causes your heart to beat faster (aerobic exercise) most days of the week.  Activities may include walking, swimming, or biking.  Work with your health care provider or diet and nutrition specialist (dietitian) to adjust your eating plan to your individual calorie needs. Reading food labels  Check food labels for the amount of sodium per serving. Choose foods with less than 5 percent of the Daily Value of sodium. Generally, foods with less than 300 mg of sodium per serving fit into this eating plan.  To find whole grains, look for the word "whole" as the first word in the ingredient list. Shopping  Buy products labeled as "low-sodium" or "no salt added."  Buy fresh foods. Avoid canned foods and premade or frozen meals. Cooking  Avoid adding salt when cooking. Use salt-free seasonings or herbs instead of table salt or sea salt. Check with your health care provider or pharmacist before using salt substitutes.  Do not fry foods. Cook foods using healthy methods such as baking, boiling, grilling, and broiling instead.  Cook with heart-healthy oils, such as olive, canola, soybean, or sunflower oil. Meal planning   Eat a balanced diet that includes: ? 5 or more servings of fruits and vegetables each day. At each meal, try to fill half of your plate with fruits and vegetables. ? Up to 6-8 servings of whole grains each day. ? Less than 6 oz of lean meat, poultry, or fish each day. A 3-oz serving of meat is about the same size as a deck of cards. One egg equals 1 oz. ? 2 servings of low-fat dairy each day. ? A serving of nuts, seeds, or beans  5 times each week. ? Heart-healthy fats. Healthy fats called Omega-3 fatty acids are found in foods such as flaxseeds and coldwater fish, like sardines, salmon, and mackerel.  Limit how much you eat of the following: ? Canned or prepackaged foods. ? Food that is high in trans fat, such as fried foods. ? Food that is high in saturated fat, such as fatty meat. ? Sweets, desserts, sugary drinks, and other foods with added  sugar. ? Full-fat dairy products.  Do not salt foods before eating.  Try to eat at least 2 vegetarian meals each week.  Eat more home-cooked food and less restaurant, buffet, and fast food.  When eating at a restaurant, ask that your food be prepared with less salt or no salt, if possible. What foods are recommended? The items listed may not be a complete list. Talk with your dietitian about what dietary choices are best for you. Grains Whole-grain or whole-wheat bread. Whole-grain or whole-wheat pasta. Brown rice. [place] Morrow. Bulgur. Whole-grain and low-sodium cereals. Pita bread. Low-fat, low-sodium crackers. Whole-wheat flour tortillas. Vegetables Fresh or frozen vegetables (raw, steamed, roasted, or grilled). Low-sodium or reduced-sodium tomato and vegetable juice. Low-sodium or reduced-sodium tomato sauce and tomato paste. Low-sodium or reduced-sodium canned vegetables. Fruits All fresh, dried, or frozen fruit. Canned fruit in natural juice (without added sugar). Meat and other protein foods Skinless chicken or [place]. Ground chicken or [place]. Pork with fat trimmed off. Fish and seafood. Egg whites. Dried beans, peas, or lentils. Unsalted nuts, nut butters, and seeds. Unsalted canned beans. Lean cuts of beef with fat trimmed off. Low-sodium, lean deli meat. Dairy Low-fat (1%) or fat-free (skim) milk. Fat-free, low-fat, or reduced-fat cheeses. Nonfat, low-sodium ricotta or cottage cheese. Low-fat or nonfat yogurt. Low-fat, low-sodium cheese. Fats and oils Soft margarine without trans fats. Vegetable oil. Low-fat, reduced-fat, or light mayonnaise and salad dressings (reduced-sodium). Canola, safflower, olive, soybean, and sunflower oils. Avocado. Seasoning and other foods Herbs. Spices. Seasoning mixes without salt. Unsalted popcorn and pretzels. Fat-free sweets. What foods are not recommended? The items listed may not be a complete list. Talk with your dietitian about what  dietary choices are best for you. Grains Baked goods made with fat, such as croissants, muffins, or some breads. Dry pasta or rice meal packs. Vegetables Creamed or fried vegetables. Vegetables in a cheese sauce. Regular canned vegetables (not low-sodium or reduced-sodium). Regular canned tomato sauce and paste (not low-sodium or reduced-sodium). Regular tomato and vegetable juice (not low-sodium or reduced-sodium). Angie Fava. Olives. Fruits Canned fruit in a light or heavy syrup. Fried fruit. Fruit in cream or butter sauce. Meat and other protein foods Fatty cuts of meat. Ribs. Fried meat. Berniece Salines. Sausage. Bologna and other processed lunch meats. Salami. Fatback. Hotdogs. Bratwurst. Salted nuts and seeds. Canned beans with added salt. Canned or smoked fish. Whole eggs or egg yolks. Chicken or [place] with skin. Dairy Whole or 2% milk, cream, and half-and-half. Whole or full-fat cream cheese. Whole-fat or sweetened yogurt. Full-fat cheese. Nondairy creamers. Whipped toppings. Processed cheese and cheese spreads. Fats and oils Butter. Stick margarine. Lard. Shortening. Ghee. Bacon fat. Tropical oils, such as coconut, palm kernel, or palm oil. Seasoning and other foods Salted popcorn and pretzels. Onion salt, garlic salt, seasoned salt, table salt, and sea salt. Worcestershire sauce. Tartar sauce. Barbecue sauce. Teriyaki sauce. Soy sauce, including reduced-sodium. Steak sauce. Canned and packaged gravies. Fish sauce. Oyster sauce. Cocktail sauce. Horseradish that you find on the shelf. Ketchup. Mustard. Meat flavorings and tenderizers. Bouillon cubes.  Hot sauce and Tabasco sauce. Premade or packaged marinades. Premade or packaged taco seasonings. Relishes. Regular salad dressings. Where to find more information:  National Heart, Lung, and [place]: https://wilson-eaton.com/  American Heart Association: www.heart.org Summary  The DASH eating plan is a healthy eating plan that has been shown to reduce  high blood pressure (hypertension). It may also reduce your risk for type 2 diabetes, heart disease, and stroke.  With the DASH eating plan, you should limit salt (sodium) intake to 2,300 mg a day. If you have hypertension, you may need to reduce your sodium intake to 1,500 mg a day.  When on the DASH eating plan, aim to eat more fresh fruits and vegetables, whole grains, lean proteins, low-fat dairy, and heart-healthy fats.  Work with your health care provider or diet and nutrition specialist (dietitian) to adjust your eating plan to your individual calorie needs. This information is not intended to replace advice given to you by your health care provider. Make sure you discuss any questions you have with your health care provider. Document Released: 08/01/2011 Document Revised: 08/05/2016 Document Reviewed: 08/05/2016 Elsevier Interactive Patient Education  [employer].    If you need a refill on your cardiac medications before your next appointment, please call your pharmacy.

## 2018-02-04 NOTE — Progress Notes (Signed)
Cardiology Office Note:    Date:  02/04/2018   ID:  Alison York, DOB [birth year], MRN [id number]  PCP:  Cyndi Bender, PA-C  Cardiologist:   Daune Perch, NP/Dr. Rayann Heman   Referring MD: Cyndi Bender, PA-C   Chief Complaint  Patient presents with  . Shortness of Breath    History of Present Illness:    Alison York is a 77 y.o. female who is being seen today for the evaluation of Dyspnea on exertion at the request of Cyndi Bender, PA-C.  The patient has a past medical history significant for pre-diabetes, GERD, osteoporosis, hypercholesterolemia, CKD stage 3. At her PCP visit on 12/05/17 Alison York had complained of worsening shortness of breath with minimal exertion. She had reduced hear acitivty level, no longer going shopping due to shortness of breath just walking through the store. SHe was also short of breath taking a shower. She has had no prior cardiac workup. She is a non-smoker, she smoiked over a pack a day but quit at age 42.   Alison York is here today alone. She reports that in early April she was taking a shower and became weak, short of breath and felt like she was going to pass out. About a week prior to that she got up in the morning and as she was walking to the kitchen she felt a grabbing pain across her upper back accompanied by shortness of breath. It resolved in less than a minute after she sat down and put her head between her legs. She has had no symptoms except for occ mild lightheadedness since the shower episode. She does get mildly short of breath if she is exerting herself more than usual. She is easily tired like with washing the dishes and has to rest after. She denies edema, orthopnea, PND, palpitations, lightheadedness or syncope.   Last A1c was 5.9 in 04/2016   PAD Screen 02/04/2018  Previous PAD dx? No  Previous surgical procedure? No  Pain with walking? No  Feet/toe relief with dangling? No  Painful, non-healing ulcers? No  Extremities discolored? No       Past Medical History:  Diagnosis Date  . Anxiety state, unspecified   . Cerebral hemorrhage ([place]) 1995  . Dysphagia   . Erosive esophagitis   . Esophageal reflux   . Esophageal stricture   . Hiatal hernia   . Hypercholesterolemia   . Internal hemorrhoids   . Osteoporosis   . Personal history of colonic polyps 1999 & 11/04/2011   villous adenoma & tubular adenoma  . Prediabetes   . Restless legs syndrome (RLS)   . Vitamin D deficiency     Past Surgical History:  Procedure Laterality Date  . BREAST BIOPSY     bilateral  . CHONDROPLASTY  11/05/2010  . KNEE ARTHROSCOPY W/ MENISCECTOMY  11/05/2010  . PARTIAL HYSTERECTOMY  age 37-35  . TONSILLECTOMY      Current Medications: Current Meds  Medication Sig  . alendronate (FOSAMAX) 70 MG tablet Take 70 mg by mouth every Thursday. Take with a full glass of water on an empty stomach....(ON HOLD)  . ALPRAZolam (XANAX) 0.5 MG tablet Take 0.5 mg by mouth 3 (three) times daily as needed.  Marland Kitchen aspirin 81 MG tablet Take 81 mg by mouth daily.  . Calcium-Vitamin D 600-125 MG-UNIT TABS Take 1 tablet by mouth daily.  . Multiple Vitamin (MULTIVITAMIN) capsule Take 1 capsule by mouth daily.  . naproxen (NAPROSYN) 500 MG tablet Take 1 tablet by  mouth as needed.  Marland Kitchen omeprazole (PRILOSEC) 40 MG capsule Take 40 mg by mouth daily.  Marland Kitchen PARoxetine (PAXIL) 20 MG tablet Take 20 mg by mouth every morning.   Current Facility-Administered Medications for the 02/04/18 encounter (Office Visit) with Daune Perch, NP  Medication  . 0.9 %  sodium chloride infusion     Allergies:   Simvastatin   Social History   Socioeconomic History  . Marital status: Married    Spouse name: Not on file  . Number of children: 2  . Years of education: Not on file  . Highest education level: Not on file  Occupational History  . Occupation: retired works part time at [employer]  . Financial resource strain: Not on file  . Food insecurity:     Worry: Not on file    Inability: Not on file  . Transportation needs:    Medical: Not on file    Non-medical: Not on file  Tobacco Use  . Smoking status: Former Smoker    Last attempt to quit: 08/26/1972    Years since quitting: 45.4  . Smokeless tobacco: Never Used  Substance and Sexual Activity  . Alcohol use: No  . Drug use: No  . Sexual activity: Not on file  Lifestyle  . Physical activity:    Days per week: Not on file    Minutes per session: Not on file  . Stress: Not on file  Relationships  . Social connections:    Talks on phone: Not on file    Gets together: Not on file    Attends religious service: Not on file    Active member of club or organization: Not on file    Attends meetings of clubs or organizations: Not on file    Relationship status: Not on file  Other Topics Concern  . Not on file  Social History Narrative  . Not on file     Family History: The patient's family history includes Alzheimer's disease in her sister; Breast cancer in her daughter and sister; CAD in her mother; Diabetes in her sister; Heart failure in her sister; Hypertension in her father; Kidney cancer in her brother; Mental illness in her sister; Stroke in her father; Tuberculosis in her mother. There is no history of Colon cancer. ROS:   Please see the history of present illness.     All other systems reviewed and are negative.  EKGs/Labs/Other Studies Reviewed:    The following studies were reviewed today: None  EKG:  EKG is ordered today.  The ekg ordered today demonstrates NSR, 65 bpm, RBBB  Recent Labs: No results found for requested labs within last 8760 hours.   Recent Lipid Panel No results found for: CHOL, TRIG, HDL, CHOLHDL, VLDL, LDLCALC, LDLDIRECT  Physical Exam:    VS:  BP 130/70   Pulse 71   Ht 5\' 5"  (1.651 m)   Wt 250 lb (113.4 kg)   SpO2 97%   BMI 41.60 kg/m     Wt Readings from Last 3 Encounters:  02/04/18 250 lb (113.4 kg)  01/29/17 252 lb (114.3 kg)   12/23/16 252 lb (114.3 kg)     GEN:  Well nourished, well developed in no acute distress HEENT: Normal NECK: No JVD; No carotid bruits LYMPHATICS: No lymphadenopathy CARDIAC: RRR, no murmurs, rubs, gallops RESPIRATORY:  Clear to auscultation without rales, wheezing or rhonchi  ABDOMEN: Soft, non-tender, non-distended MUSCULOSKELETAL:  No edema; No deformity  SKIN: Warm and dry  NEUROLOGIC:  Alert and oriented x 3 PSYCHIATRIC:  Normal affect   ASSESSMENT:    1. Dyspnea, unspecified type   2. Chest tightness    PLAN:    This patient's case was discussed in depth with Dr. Rayann Heman. The plan below was formulated per our discussion.  In order of problems listed above:  Dyspnea on exertion: Pt had 2 episodes of sudden shortness of breath with weakness, one was associated with a grabbing pain across the back while walking in her home and one occurred while in the shower. None of these symptoms has reoccured since early April but she does have mild shortness of breath with more intense exertion and fatigue after daily activities like washing dishes. EKG shows RBBB. CVD risk factors include pre-diabetes, hypercholesterolemia, remote smoker, possibly family history (pt is vague) Her 10 year risk of CVD is 35.8%. With optimal risk factor modification it would be 14.9% Will check lexiscan myoview (pt unable to walk on treadmill due to knee pain) and echocardiogram for wall motion, LV function and valvulopathy.   We discussed risk factor modification including light exercise, health healthy diet and improving her cholesterol levels.  Will follow up after testing. If all testing normal, we can release her back to her PCP.  Hypercholesterolemia: On pravastatin (did not tolerate simvastatin due to myalgias in the legs). LDL was 145, HDL 61 in April but pt says that she was not taking her medication regularly at the time. Her PCP advised her to take it regularly and she has been. Will recheck lipid  panel to assess the adequacy of response to pravastatin.   CKD stage 3: SCr was 1.1 on 12/03/17.  Continues to take NSAIDs for arthritic knee pain.  GERD: EGD in 2018 showed some erosive gastropathy, hiatal hernia. Symptoms well controlled on daily omeprazole.  Medication Adjustments/Labs and Tests Ordered: Current medicines are reviewed at length with the patient today.  Concerns regarding medicines are outlined above. Labs and tests ordered and medication changes are outlined in the patient instructions below:  Patient Instructions  Medication Instructions: Your physician recommends that you continue on your current medications as directed. Please refer to the Current Medication list given to you today.   Labwork: TODAY: LIPIDS  Procedures/Testing: Your physician has requested that you have a lexiscan myoview. For further information please visit [email or web address]. Please follow instruction sheet, as given.  Your physician has requested that you have an echocardiogram. Echocardiography is a painless test that uses sound waves to create images of your heart. It provides your doctor with information about the size and shape of your heart and how well your heart's chambers and valves are working. This procedure takes approximately one hour. There are no restrictions for this procedure.    Follow-Up: Your physician recommends that you schedule a follow-up appointment in: 1 month with Daune Perch NP   Any Additional Special Instructions Will Be Listed Below (If Applicable).   DASH Eating Plan DASH stands for "Dietary Approaches to Stop Hypertension." The DASH eating plan is a healthy eating plan that has been shown to reduce high blood pressure (hypertension). It may also reduce your risk for type 2 diabetes, heart disease, and stroke. The DASH eating plan may also help with weight loss. What are tips for following this plan? General guidelines  Avoid eating more than 2,300 mg  (milligrams) of salt (sodium) a day. If you have hypertension, you may need to reduce your sodium intake to 1,500 mg a day.  Limit  alcohol intake to no more than 1 drink a day for nonpregnant women and 2 drinks a day for men. One drink equals 12 oz of beer, 5 oz of wine, or 1 oz of hard liquor.  Work with your health care provider to maintain a healthy body weight or to lose weight. Ask what an ideal weight is for you.  Get at least 30 minutes of exercise that causes your heart to beat faster (aerobic exercise) most days of the week. Activities may include walking, swimming, or biking.  Work with your health care provider or diet and nutrition specialist (dietitian) to adjust your eating plan to your individual calorie needs. Reading food labels  Check food labels for the amount of sodium per serving. Choose foods with less than 5 percent of the Daily Value of sodium. Generally, foods with less than 300 mg of sodium per serving fit into this eating plan.  To find whole grains, look for the word "whole" as the first word in the ingredient list. Shopping  Buy products labeled as "low-sodium" or "no salt added."  Buy fresh foods. Avoid canned foods and premade or frozen meals. Cooking  Avoid adding salt when cooking. Use salt-free seasonings or herbs instead of table salt or sea salt. Check with your health care provider or pharmacist before using salt substitutes.  Do not fry foods. Cook foods using healthy methods such as baking, boiling, grilling, and broiling instead.  Cook with heart-healthy oils, such as olive, canola, soybean, or sunflower oil. Meal planning   Eat a balanced diet that includes: ? 5 or more servings of fruits and vegetables each day. At each meal, try to fill half of your plate with fruits and vegetables. ? Up to 6-8 servings of whole grains each day. ? Less than 6 oz of lean meat, poultry, or fish each day. A 3-oz serving of meat is about the same size as a deck  of cards. One egg equals 1 oz. ? 2 servings of low-fat dairy each day. ? A serving of nuts, seeds, or beans 5 times each week. ? Heart-healthy fats. Healthy fats called Omega-3 fatty acids are found in foods such as flaxseeds and coldwater fish, like sardines, salmon, and mackerel.  Limit how much you eat of the following: ? Canned or prepackaged foods. ? Food that is high in trans fat, such as fried foods. ? Food that is high in saturated fat, such as fatty meat. ? Sweets, desserts, sugary drinks, and other foods with added sugar. ? Full-fat dairy products.  Do not salt foods before eating.  Try to eat at least 2 vegetarian meals each week.  Eat more home-cooked food and less restaurant, buffet, and fast food.  When eating at a restaurant, ask that your food be prepared with less salt or no salt, if possible. What foods are recommended? The items listed may not be a complete list. Talk with your dietitian about what dietary choices are best for you. Grains Whole-grain or whole-wheat bread. Whole-grain or whole-wheat pasta. Brown rice. [place]. Bulgur. Whole-grain and low-sodium cereals. Pita bread. Low-fat, low-sodium crackers. Whole-wheat flour tortillas. Vegetables Fresh or frozen vegetables (raw, steamed, roasted, or grilled). Low-sodium or reduced-sodium tomato and vegetable juice. Low-sodium or reduced-sodium tomato sauce and tomato paste. Low-sodium or reduced-sodium canned vegetables. Fruits All fresh, dried, or frozen fruit. Canned fruit in natural juice (without added sugar). Meat and other protein foods Skinless chicken or [place]. Ground chicken or [place]. Pork with fat trimmed off.  Fish and seafood. Egg whites. Dried beans, peas, or lentils. Unsalted nuts, nut butters, and seeds. Unsalted canned beans. Lean cuts of beef with fat trimmed off. Low-sodium, lean deli meat. Dairy Low-fat (1%) or fat-free (skim) milk. Fat-free, low-fat, or reduced-fat cheeses. Nonfat,  low-sodium ricotta or cottage cheese. Low-fat or nonfat yogurt. Low-fat, low-sodium cheese. Fats and oils Soft margarine without trans fats. Vegetable oil. Low-fat, reduced-fat, or light mayonnaise and salad dressings (reduced-sodium). Canola, safflower, olive, soybean, and sunflower oils. Avocado. Seasoning and other foods Herbs. Spices. Seasoning mixes without salt. Unsalted popcorn and pretzels. Fat-free sweets. What foods are not recommended? The items listed may not be a complete list. Talk with your dietitian about what dietary choices are best for you. Grains Baked goods made with fat, such as croissants, muffins, or some breads. Dry pasta or rice meal packs. Vegetables Creamed or fried vegetables. Vegetables in a cheese sauce. Regular canned vegetables (not low-sodium or reduced-sodium). Regular canned tomato sauce and paste (not low-sodium or reduced-sodium). Regular tomato and vegetable juice (not low-sodium or reduced-sodium). Angie Fava. Olives. Fruits Canned fruit in a light or heavy syrup. Fried fruit. Fruit in cream or butter sauce. Meat and other protein foods Fatty cuts of meat. Ribs. Fried meat. Berniece Salines. Sausage. Bologna and other processed lunch meats. Salami. Fatback. Hotdogs. Bratwurst. Salted nuts and seeds. Canned beans with added salt. Canned or smoked fish. Whole eggs or egg yolks. Chicken or [place] with skin. Dairy Whole or 2% milk, cream, and half-and-half. Whole or full-fat cream cheese. Whole-fat or sweetened yogurt. Full-fat cheese. Nondairy creamers. Whipped toppings. Processed cheese and cheese spreads. Fats and oils Butter. Stick margarine. Lard. Shortening. Ghee. Bacon fat. Tropical oils, such as coconut, palm kernel, or palm oil. Seasoning and other foods Salted popcorn and pretzels. Onion salt, garlic salt, seasoned salt, table salt, and sea salt. Worcestershire sauce. Tartar sauce. Barbecue sauce. Teriyaki sauce. Soy sauce, including reduced-sodium. Steak sauce.  Canned and packaged gravies. Fish sauce. Oyster sauce. Cocktail sauce. Horseradish that you find on the shelf. Ketchup. Mustard. Meat flavorings and tenderizers. Bouillon cubes. Hot sauce and Tabasco sauce. Premade or packaged marinades. Premade or packaged taco seasonings. Relishes. Regular salad dressings. Where to find more information:  National Heart, Lung, and [place]: https://wilson-eaton.com/  American Heart Association: www.heart.org Summary  The DASH eating plan is a healthy eating plan that has been shown to reduce high blood pressure (hypertension). It may also reduce your risk for type 2 diabetes, heart disease, and stroke.  With the DASH eating plan, you should limit salt (sodium) intake to 2,300 mg a day. If you have hypertension, you may need to reduce your sodium intake to 1,500 mg a day.  When on the DASH eating plan, aim to eat more fresh fruits and vegetables, whole grains, lean proteins, low-fat dairy, and heart-healthy fats.  Work with your health care provider or diet and nutrition specialist (dietitian) to adjust your eating plan to your individual calorie needs. This information is not intended to replace advice given to you by your health care provider. Make sure you discuss any questions you have with your health care provider. Document Released: 08/01/2011 Document Revised: 08/05/2016 Document Reviewed: 08/05/2016 Elsevier Interactive Patient Education  [employer].    If you need a refill on your cardiac medications before your next appointment, please call your pharmacy.      Signed, Daune Perch, NP  02/04/2018 2:57 PM    [facility]

## 2018-02-05 ENCOUNTER — Other Ambulatory Visit: Payer: Self-pay

## 2018-02-05 ENCOUNTER — Ambulatory Visit (HOSPITAL_COMMUNITY): Payer: Medicare HMO | Attending: Cardiology

## 2018-02-05 DIAGNOSIS — E785 Hyperlipidemia, unspecified: Secondary | ICD-10-CM | POA: Insufficient documentation

## 2018-02-05 DIAGNOSIS — R0609 Other forms of dyspnea: Secondary | ICD-10-CM | POA: Diagnosis not present

## 2018-02-05 DIAGNOSIS — N189 Chronic kidney disease, unspecified: Secondary | ICD-10-CM | POA: Insufficient documentation

## 2018-02-05 DIAGNOSIS — R7303 Prediabetes: Secondary | ICD-10-CM | POA: Insufficient documentation

## 2018-02-05 DIAGNOSIS — I081 Rheumatic disorders of both mitral and tricuspid valves: Secondary | ICD-10-CM | POA: Diagnosis not present

## 2018-02-05 LAB — LIPID PANEL
CHOL/HDL RATIO: 3.6 ratio (ref 0.0–4.4)
CHOLESTEROL TOTAL: 225 mg/dL — AB (ref 100–199)
HDL: 62 mg/dL (ref >39)
LDL CALC: 118 mg/dL — AB (ref 0–99)
Triglycerides: 227 mg/dL — ABNORMAL HIGH (ref 0–149)
VLDL CHOLESTEROL CAL: 45 mg/dL — AB (ref 5–40)

## 2018-02-10 ENCOUNTER — Telehealth: Payer: Self-pay

## 2018-02-10 ENCOUNTER — Telehealth (HOSPITAL_COMMUNITY): Payer: Self-pay | Admitting: *Deleted

## 2018-02-10 MED ORDER — PRAVASTATIN SODIUM 80 MG PO TABS: 80.0000 mg | ORAL_TABLET | Freq: Every evening | ORAL | 3 refills | Status: AC

## 2018-02-10 NOTE — Telephone Encounter (Signed)
Patient given detailed instructions per Myocardial Perfusion Study Information Sheet for the test on 02/13/18 at 0945. Patient notified to arrive 15 minutes early and that it is imperative to arrive on time for appointment to keep from having the test rescheduled.  If you need to cancel or reschedule your appointment, please call the office within 24 hours of your appointment. . Patient verbalized understanding.Alison York, Ranae Palms

## 2018-02-10 NOTE — Telephone Encounter (Signed)
Per Daune Perch to increase Pravastatin to 80 mg daily. See result note 02/04/18

## 2018-02-13 ENCOUNTER — Ambulatory Visit (HOSPITAL_COMMUNITY): Payer: Medicare HMO | Attending: Cardiology

## 2018-02-13 DIAGNOSIS — I4510 Unspecified right bundle-branch block: Secondary | ICD-10-CM | POA: Insufficient documentation

## 2018-02-13 DIAGNOSIS — R079 Chest pain, unspecified: Secondary | ICD-10-CM | POA: Insufficient documentation

## 2018-02-13 DIAGNOSIS — R0609 Other forms of dyspnea: Secondary | ICD-10-CM | POA: Diagnosis not present

## 2018-02-13 MED ORDER — REGADENOSON 0.4 MG/5ML IV SOLN
0.4000 mg | Freq: Once | INTRAVENOUS | Status: AC
  Administered 2018-02-13: 0.4 mg via INTRAVENOUS

## 2018-02-13 MED ORDER — TECHNETIUM TC 99M TETROFOSMIN IV KIT
33.0000 | PACK | Freq: Once | INTRAVENOUS | Status: AC | PRN
  Administered 2018-02-13: 33 via INTRAVENOUS
  Filled 2018-02-13: qty 33

## 2018-02-16 ENCOUNTER — Ambulatory Visit (HOSPITAL_COMMUNITY): Payer: Medicare HMO | Attending: Internal Medicine

## 2018-02-16 LAB — MYOCARDIAL PERFUSION IMAGING
CHL CUP NUCLEAR SRS: 4
CHL CUP NUCLEAR SSS: 7
CHL CUP RESTING HR STRESS: 61 {beats}/min
CSEPPHR: 73 {beats}/min
LHR: 0.33
LV dias vol: 83 mL (ref 46–106)
LV sys vol: 30 mL
NUC STRESS TID: 0.9
SDS: 3

## 2018-02-16 MED ORDER — TECHNETIUM TC 99M TETROFOSMIN IV KIT
32.2000 | PACK | Freq: Once | INTRAVENOUS | Status: AC | PRN
  Administered 2018-02-16: 32.2 via INTRAVENOUS
  Filled 2018-02-16: qty 33

## 2018-04-10 DIAGNOSIS — Z1331 Encounter for screening for depression: Secondary | ICD-10-CM | POA: Diagnosis not present

## 2018-04-10 DIAGNOSIS — Z1339 Encounter for screening examination for other mental health and behavioral disorders: Secondary | ICD-10-CM | POA: Diagnosis not present

## 2018-04-10 DIAGNOSIS — Z9181 History of falling: Secondary | ICD-10-CM | POA: Diagnosis not present

## 2018-04-10 DIAGNOSIS — E785 Hyperlipidemia, unspecified: Secondary | ICD-10-CM | POA: Diagnosis not present

## 2018-04-10 DIAGNOSIS — E669 Obesity, unspecified: Secondary | ICD-10-CM | POA: Diagnosis not present

## 2018-04-10 DIAGNOSIS — Z0000 Encounter for general adult medical examination without abnormal findings: Secondary | ICD-10-CM | POA: Diagnosis not present

## 2018-04-10 DIAGNOSIS — Z6841 Body Mass Index (BMI) 40.0 and over, adult: Secondary | ICD-10-CM | POA: Diagnosis not present

## 2018-04-10 DIAGNOSIS — Z136 Encounter for screening for cardiovascular disorders: Secondary | ICD-10-CM | POA: Diagnosis not present

## 2018-06-08 DIAGNOSIS — M25511 Pain in right shoulder: Secondary | ICD-10-CM | POA: Diagnosis not present

## 2018-06-09 DIAGNOSIS — E538 Deficiency of other specified B group vitamins: Secondary | ICD-10-CM | POA: Diagnosis not present

## 2018-06-09 DIAGNOSIS — E559 Vitamin D deficiency, unspecified: Secondary | ICD-10-CM | POA: Diagnosis not present

## 2018-06-09 DIAGNOSIS — E7800 Pure hypercholesterolemia, unspecified: Secondary | ICD-10-CM | POA: Diagnosis not present

## 2018-06-09 DIAGNOSIS — N183 Chronic kidney disease, stage 3 (moderate): Secondary | ICD-10-CM | POA: Diagnosis not present

## 2018-06-09 DIAGNOSIS — Z23 Encounter for immunization: Secondary | ICD-10-CM | POA: Diagnosis not present

## 2018-06-12 DIAGNOSIS — Z1331 Encounter for screening for depression: Secondary | ICD-10-CM | POA: Diagnosis not present

## 2018-06-12 DIAGNOSIS — F419 Anxiety disorder, unspecified: Secondary | ICD-10-CM | POA: Diagnosis not present

## 2018-06-12 DIAGNOSIS — Z139 Encounter for screening, unspecified: Secondary | ICD-10-CM | POA: Diagnosis not present

## 2018-06-12 DIAGNOSIS — E7800 Pure hypercholesterolemia, unspecified: Secondary | ICD-10-CM | POA: Diagnosis not present

## 2018-06-12 DIAGNOSIS — N390 Urinary tract infection, site not specified: Secondary | ICD-10-CM | POA: Diagnosis not present

## 2018-06-12 DIAGNOSIS — K219 Gastro-esophageal reflux disease without esophagitis: Secondary | ICD-10-CM | POA: Diagnosis not present

## 2018-06-12 DIAGNOSIS — N183 Chronic kidney disease, stage 3 (moderate): Secondary | ICD-10-CM | POA: Diagnosis not present

## 2018-06-12 DIAGNOSIS — Z79899 Other long term (current) drug therapy: Secondary | ICD-10-CM | POA: Diagnosis not present

## 2018-06-12 DIAGNOSIS — M7551 Bursitis of right shoulder: Secondary | ICD-10-CM | POA: Diagnosis not present

## 2018-08-26 DIAGNOSIS — I2699 Other pulmonary embolism without acute cor pulmonale: Secondary | ICD-10-CM

## 2018-08-26 HISTORY — DX: Other pulmonary embolism without acute cor pulmonale: I26.99

## 2018-08-26 HISTORY — PX: SHOULDER SURGERY: SHX246

## 2018-09-08 DIAGNOSIS — L570 Actinic keratosis: Secondary | ICD-10-CM | POA: Diagnosis not present

## 2018-12-11 DIAGNOSIS — E7800 Pure hypercholesterolemia, unspecified: Secondary | ICD-10-CM | POA: Diagnosis not present

## 2018-12-11 DIAGNOSIS — F419 Anxiety disorder, unspecified: Secondary | ICD-10-CM | POA: Diagnosis not present

## 2018-12-11 DIAGNOSIS — N183 Chronic kidney disease, stage 3 (moderate): Secondary | ICD-10-CM | POA: Diagnosis not present

## 2018-12-11 DIAGNOSIS — M810 Age-related osteoporosis without current pathological fracture: Secondary | ICD-10-CM | POA: Diagnosis not present

## 2018-12-11 DIAGNOSIS — G2581 Restless legs syndrome: Secondary | ICD-10-CM | POA: Diagnosis not present

## 2018-12-11 DIAGNOSIS — K219 Gastro-esophageal reflux disease without esophagitis: Secondary | ICD-10-CM | POA: Diagnosis not present

## 2019-02-12 DIAGNOSIS — Z6841 Body Mass Index (BMI) 40.0 and over, adult: Secondary | ICD-10-CM | POA: Diagnosis not present

## 2019-02-12 DIAGNOSIS — Z79899 Other long term (current) drug therapy: Secondary | ICD-10-CM | POA: Diagnosis not present

## 2019-02-12 DIAGNOSIS — F419 Anxiety disorder, unspecified: Secondary | ICD-10-CM | POA: Diagnosis not present

## 2019-02-12 DIAGNOSIS — G2581 Restless legs syndrome: Secondary | ICD-10-CM | POA: Diagnosis not present

## 2019-02-12 DIAGNOSIS — E7800 Pure hypercholesterolemia, unspecified: Secondary | ICD-10-CM | POA: Diagnosis not present

## 2019-02-12 DIAGNOSIS — M810 Age-related osteoporosis without current pathological fracture: Secondary | ICD-10-CM | POA: Diagnosis not present

## 2019-02-12 DIAGNOSIS — K219 Gastro-esophageal reflux disease without esophagitis: Secondary | ICD-10-CM | POA: Diagnosis not present

## 2019-02-12 DIAGNOSIS — N183 Chronic kidney disease, stage 3 (moderate): Secondary | ICD-10-CM | POA: Diagnosis not present

## 2019-04-15 DIAGNOSIS — S42231A 3-part fracture of surgical neck of right humerus, initial encounter for closed fracture: Secondary | ICD-10-CM | POA: Diagnosis not present

## 2019-04-15 DIAGNOSIS — W19XXXA Unspecified fall, initial encounter: Secondary | ICD-10-CM | POA: Diagnosis not present

## 2019-04-15 DIAGNOSIS — Z1331 Encounter for screening for depression: Secondary | ICD-10-CM | POA: Diagnosis not present

## 2019-04-15 DIAGNOSIS — M25521 Pain in right elbow: Secondary | ICD-10-CM | POA: Diagnosis not present

## 2019-04-15 DIAGNOSIS — E785 Hyperlipidemia, unspecified: Secondary | ICD-10-CM | POA: Diagnosis not present

## 2019-04-15 DIAGNOSIS — S2231XA Fracture of one rib, right side, initial encounter for closed fracture: Secondary | ICD-10-CM | POA: Diagnosis not present

## 2019-04-15 DIAGNOSIS — R52 Pain, unspecified: Secondary | ICD-10-CM | POA: Diagnosis not present

## 2019-04-15 DIAGNOSIS — R0781 Pleurodynia: Secondary | ICD-10-CM | POA: Diagnosis not present

## 2019-04-15 DIAGNOSIS — S42211A Unspecified displaced fracture of surgical neck of right humerus, initial encounter for closed fracture: Secondary | ICD-10-CM | POA: Diagnosis not present

## 2019-04-15 DIAGNOSIS — M25511 Pain in right shoulder: Secondary | ICD-10-CM | POA: Diagnosis not present

## 2019-04-15 DIAGNOSIS — M25519 Pain in unspecified shoulder: Secondary | ICD-10-CM | POA: Diagnosis not present

## 2019-04-15 DIAGNOSIS — S42301A Unspecified fracture of shaft of humerus, right arm, initial encounter for closed fracture: Secondary | ICD-10-CM | POA: Diagnosis not present

## 2019-04-15 DIAGNOSIS — Z9181 History of falling: Secondary | ICD-10-CM | POA: Diagnosis not present

## 2019-04-15 DIAGNOSIS — Z0000 Encounter for general adult medical examination without abnormal findings: Secondary | ICD-10-CM | POA: Diagnosis not present

## 2019-04-15 DIAGNOSIS — Z6841 Body Mass Index (BMI) 40.0 and over, adult: Secondary | ICD-10-CM | POA: Diagnosis not present

## 2019-04-15 DIAGNOSIS — S59901A Unspecified injury of right elbow, initial encounter: Secondary | ICD-10-CM | POA: Diagnosis not present

## 2019-04-21 DIAGNOSIS — M79609 Pain in unspecified limb: Secondary | ICD-10-CM | POA: Diagnosis not present

## 2019-04-21 DIAGNOSIS — Z01818 Encounter for other preprocedural examination: Secondary | ICD-10-CM | POA: Diagnosis not present

## 2019-04-21 DIAGNOSIS — R52 Pain, unspecified: Secondary | ICD-10-CM | POA: Diagnosis not present

## 2019-04-21 DIAGNOSIS — Z79899 Other long term (current) drug therapy: Secondary | ICD-10-CM | POA: Diagnosis not present

## 2019-04-21 DIAGNOSIS — S42201A Unspecified fracture of upper end of right humerus, initial encounter for closed fracture: Secondary | ICD-10-CM | POA: Diagnosis not present

## 2019-04-21 DIAGNOSIS — E559 Vitamin D deficiency, unspecified: Secondary | ICD-10-CM | POA: Diagnosis not present

## 2019-04-28 DIAGNOSIS — K219 Gastro-esophageal reflux disease without esophagitis: Secondary | ICD-10-CM | POA: Diagnosis not present

## 2019-04-28 DIAGNOSIS — S42241A 4-part fracture of surgical neck of right humerus, initial encounter for closed fracture: Secondary | ICD-10-CM | POA: Diagnosis not present

## 2019-04-28 DIAGNOSIS — M85821 Other specified disorders of bone density and structure, right upper arm: Secondary | ICD-10-CM | POA: Diagnosis not present

## 2019-04-28 DIAGNOSIS — S42201A Unspecified fracture of upper end of right humerus, initial encounter for closed fracture: Secondary | ICD-10-CM | POA: Diagnosis not present

## 2019-04-28 DIAGNOSIS — E785 Hyperlipidemia, unspecified: Secondary | ICD-10-CM | POA: Diagnosis not present

## 2019-04-28 DIAGNOSIS — Z79891 Long term (current) use of opiate analgesic: Secondary | ICD-10-CM | POA: Diagnosis not present

## 2019-04-28 DIAGNOSIS — M84621A Pathological fracture in other disease, right humerus, initial encounter for fracture: Secondary | ICD-10-CM | POA: Diagnosis not present

## 2019-04-28 DIAGNOSIS — Z471 Aftercare following joint replacement surgery: Secondary | ICD-10-CM | POA: Diagnosis not present

## 2019-04-28 DIAGNOSIS — E669 Obesity, unspecified: Secondary | ICD-10-CM | POA: Diagnosis not present

## 2019-04-28 DIAGNOSIS — M810 Age-related osteoporosis without current pathological fracture: Secondary | ICD-10-CM | POA: Diagnosis not present

## 2019-04-28 DIAGNOSIS — G8918 Other acute postprocedural pain: Secondary | ICD-10-CM | POA: Diagnosis not present

## 2019-04-28 DIAGNOSIS — J302 Other seasonal allergic rhinitis: Secondary | ICD-10-CM | POA: Diagnosis not present

## 2019-04-28 DIAGNOSIS — M19011 Primary osteoarthritis, right shoulder: Secondary | ICD-10-CM | POA: Diagnosis not present

## 2019-04-28 DIAGNOSIS — Z96611 Presence of right artificial shoulder joint: Secondary | ICD-10-CM | POA: Diagnosis not present

## 2019-04-28 DIAGNOSIS — Z87891 Personal history of nicotine dependence: Secondary | ICD-10-CM | POA: Diagnosis not present

## 2019-05-05 DIAGNOSIS — Z20828 Contact with and (suspected) exposure to other viral communicable diseases: Secondary | ICD-10-CM | POA: Diagnosis not present

## 2019-05-10 DIAGNOSIS — J189 Pneumonia, unspecified organism: Secondary | ICD-10-CM | POA: Diagnosis not present

## 2019-05-10 DIAGNOSIS — Z1159 Encounter for screening for other viral diseases: Secondary | ICD-10-CM | POA: Diagnosis not present

## 2019-05-10 DIAGNOSIS — R0602 Shortness of breath: Secondary | ICD-10-CM | POA: Diagnosis not present

## 2019-05-10 DIAGNOSIS — Z6841 Body Mass Index (BMI) 40.0 and over, adult: Secondary | ICD-10-CM | POA: Diagnosis not present

## 2019-05-12 DIAGNOSIS — R0602 Shortness of breath: Secondary | ICD-10-CM | POA: Diagnosis not present

## 2019-05-12 DIAGNOSIS — I700 Atherosclerosis of aorta: Secondary | ICD-10-CM | POA: Diagnosis not present

## 2019-05-17 DIAGNOSIS — M6281 Muscle weakness (generalized): Secondary | ICD-10-CM | POA: Diagnosis not present

## 2019-05-17 DIAGNOSIS — M25511 Pain in right shoulder: Secondary | ICD-10-CM | POA: Diagnosis not present

## 2019-05-17 DIAGNOSIS — M25611 Stiffness of right shoulder, not elsewhere classified: Secondary | ICD-10-CM | POA: Diagnosis not present

## 2019-05-18 DIAGNOSIS — R05 Cough: Secondary | ICD-10-CM | POA: Diagnosis not present

## 2019-05-18 DIAGNOSIS — S42301A Unspecified fracture of shaft of humerus, right arm, initial encounter for closed fracture: Secondary | ICD-10-CM | POA: Diagnosis not present

## 2019-05-18 DIAGNOSIS — R0602 Shortness of breath: Secondary | ICD-10-CM | POA: Diagnosis not present

## 2019-05-18 DIAGNOSIS — Z6841 Body Mass Index (BMI) 40.0 and over, adult: Secondary | ICD-10-CM | POA: Diagnosis not present

## 2019-05-18 DIAGNOSIS — K219 Gastro-esophageal reflux disease without esophagitis: Secondary | ICD-10-CM | POA: Diagnosis not present

## 2019-05-19 ENCOUNTER — Encounter (HOSPITAL_COMMUNITY): Payer: Self-pay

## 2019-05-19 ENCOUNTER — Other Ambulatory Visit: Payer: Self-pay

## 2019-05-19 ENCOUNTER — Emergency Department (HOSPITAL_COMMUNITY): Payer: Medicare HMO

## 2019-05-19 ENCOUNTER — Inpatient Hospital Stay (HOSPITAL_COMMUNITY)
Admission: EM | Admit: 2019-05-19 | Discharge: 2019-05-21 | DRG: 175 | Disposition: A | Discharge status: Home Health | Payer: Medicare HMO | Attending: Family Medicine | Admitting: Family Medicine

## 2019-05-19 DIAGNOSIS — F419 Anxiety disorder, unspecified: Secondary | ICD-10-CM

## 2019-05-19 DIAGNOSIS — J9611 Chronic respiratory failure with hypoxia: Secondary | ICD-10-CM | POA: Diagnosis not present

## 2019-05-19 DIAGNOSIS — Z833 Family history of diabetes mellitus: Secondary | ICD-10-CM

## 2019-05-19 DIAGNOSIS — N183 Chronic kidney disease, stage 3 (moderate): Secondary | ICD-10-CM | POA: Diagnosis not present

## 2019-05-19 DIAGNOSIS — Z831 Family history of other infectious and parasitic diseases: Secondary | ICD-10-CM

## 2019-05-19 DIAGNOSIS — R05 Cough: Secondary | ICD-10-CM | POA: Diagnosis not present

## 2019-05-19 DIAGNOSIS — K219 Gastro-esophageal reflux disease without esophagitis: Secondary | ICD-10-CM | POA: Diagnosis not present

## 2019-05-19 DIAGNOSIS — K222 Esophageal obstruction: Secondary | ICD-10-CM | POA: Diagnosis present

## 2019-05-19 DIAGNOSIS — Z6841 Body Mass Index (BMI) 40.0 and over, adult: Secondary | ICD-10-CM

## 2019-05-19 DIAGNOSIS — Z9981 Dependence on supplemental oxygen: Secondary | ICD-10-CM | POA: Diagnosis not present

## 2019-05-19 DIAGNOSIS — E669 Obesity, unspecified: Secondary | ICD-10-CM | POA: Diagnosis present

## 2019-05-19 DIAGNOSIS — M810 Age-related osteoporosis without current pathological fracture: Secondary | ICD-10-CM | POA: Diagnosis present

## 2019-05-19 DIAGNOSIS — R4702 Dysphasia: Secondary | ICD-10-CM | POA: Diagnosis present

## 2019-05-19 DIAGNOSIS — J449 Chronic obstructive pulmonary disease, unspecified: Secondary | ICD-10-CM | POA: Diagnosis not present

## 2019-05-19 DIAGNOSIS — J9601 Acute respiratory failure with hypoxia: Secondary | ICD-10-CM | POA: Diagnosis not present

## 2019-05-19 DIAGNOSIS — E785 Hyperlipidemia, unspecified: Secondary | ICD-10-CM | POA: Diagnosis present

## 2019-05-19 DIAGNOSIS — J984 Other disorders of lung: Secondary | ICD-10-CM | POA: Diagnosis present

## 2019-05-19 DIAGNOSIS — E559 Vitamin D deficiency, unspecified: Secondary | ICD-10-CM | POA: Diagnosis present

## 2019-05-19 DIAGNOSIS — Z823 Family history of stroke: Secondary | ICD-10-CM

## 2019-05-19 DIAGNOSIS — D649 Anemia, unspecified: Secondary | ICD-10-CM | POA: Diagnosis present

## 2019-05-19 DIAGNOSIS — E7800 Pure hypercholesterolemia, unspecified: Secondary | ICD-10-CM | POA: Diagnosis present

## 2019-05-19 DIAGNOSIS — Z79899 Other long term (current) drug therapy: Secondary | ICD-10-CM | POA: Diagnosis not present

## 2019-05-19 DIAGNOSIS — R079 Chest pain, unspecified: Secondary | ICD-10-CM | POA: Diagnosis not present

## 2019-05-19 DIAGNOSIS — I4510 Unspecified right bundle-branch block: Secondary | ICD-10-CM | POA: Diagnosis present

## 2019-05-19 DIAGNOSIS — I2693 Single subsegmental pulmonary embolism without acute cor pulmonale: Principal | ICD-10-CM | POA: Diagnosis present

## 2019-05-19 DIAGNOSIS — R0602 Shortness of breath: Secondary | ICD-10-CM | POA: Diagnosis not present

## 2019-05-19 DIAGNOSIS — Z8051 Family history of malignant neoplasm of kidney: Secondary | ICD-10-CM

## 2019-05-19 DIAGNOSIS — I2699 Other pulmonary embolism without acute cor pulmonale: Secondary | ICD-10-CM | POA: Diagnosis not present

## 2019-05-19 DIAGNOSIS — Z23 Encounter for immunization: Secondary | ICD-10-CM | POA: Diagnosis not present

## 2019-05-19 DIAGNOSIS — Z820 Family history of epilepsy and other diseases of the nervous system: Secondary | ICD-10-CM

## 2019-05-19 DIAGNOSIS — Z818 Family history of other mental and behavioral disorders: Secondary | ICD-10-CM

## 2019-05-19 DIAGNOSIS — Z8719 Personal history of other diseases of the digestive system: Secondary | ICD-10-CM | POA: Diagnosis not present

## 2019-05-19 DIAGNOSIS — Z7982 Long term (current) use of aspirin: Secondary | ICD-10-CM | POA: Diagnosis not present

## 2019-05-19 DIAGNOSIS — Z20828 Contact with and (suspected) exposure to other viral communicable diseases: Secondary | ICD-10-CM | POA: Diagnosis present

## 2019-05-19 DIAGNOSIS — Z803 Family history of malignant neoplasm of breast: Secondary | ICD-10-CM

## 2019-05-19 DIAGNOSIS — Z8673 Personal history of transient ischemic attack (TIA), and cerebral infarction without residual deficits: Secondary | ICD-10-CM

## 2019-05-19 DIAGNOSIS — F411 Generalized anxiety disorder: Secondary | ICD-10-CM | POA: Diagnosis present

## 2019-05-19 DIAGNOSIS — K449 Diaphragmatic hernia without obstruction or gangrene: Secondary | ICD-10-CM | POA: Diagnosis present

## 2019-05-19 DIAGNOSIS — K648 Other hemorrhoids: Secondary | ICD-10-CM | POA: Diagnosis present

## 2019-05-19 DIAGNOSIS — Z8249 Family history of ischemic heart disease and other diseases of the circulatory system: Secondary | ICD-10-CM

## 2019-05-19 DIAGNOSIS — Z87891 Personal history of nicotine dependence: Secondary | ICD-10-CM

## 2019-05-19 DIAGNOSIS — R7303 Prediabetes: Secondary | ICD-10-CM | POA: Diagnosis present

## 2019-05-19 LAB — CBC WITH DIFFERENTIAL/PLATELET
Abs Immature Granulocytes: 0.08 10*3/uL — ABNORMAL HIGH (ref 0.00–0.07)
Basophils Absolute: 0.1 10*3/uL (ref 0.0–0.1)
Basophils Relative: 1 %
Eosinophils Absolute: 0.5 10*3/uL (ref 0.0–0.5)
Eosinophils Relative: 5 %
HCT: 41.4 % (ref 36.0–46.0)
Hemoglobin: 12.4 g/dL (ref 12.0–15.0)
Immature Granulocytes: 1 %
Lymphocytes Relative: 25 %
Lymphs Abs: 2.4 10*3/uL (ref 0.7–4.0)
MCH: 27.7 pg (ref 26.0–34.0)
MCHC: 30 g/dL (ref 30.0–36.0)
MCV: 92.4 fL (ref 80.0–100.0)
Monocytes Absolute: 0.9 10*3/uL (ref 0.1–1.0)
Monocytes Relative: 9 %
Neutro Abs: 5.8 10*3/uL (ref 1.7–7.7)
Neutrophils Relative %: 59 %
Platelets: 288 10*3/uL (ref 150–400)
RBC: 4.48 MIL/uL (ref 3.87–5.11)
RDW: 15.9 % — ABNORMAL HIGH (ref 11.5–15.5)
WBC: 9.9 10*3/uL (ref 4.0–10.5)
nRBC: 0 % (ref 0.0–0.2)

## 2019-05-19 LAB — BASIC METABOLIC PANEL
Anion gap: 11 (ref 5–15)
BUN: 16 mg/dL (ref 8–23)
CO2: 23 mmol/L (ref 22–32)
Calcium: 8.8 mg/dL — ABNORMAL LOW (ref 8.9–10.3)
Chloride: 106 mmol/L (ref 98–111)
Creatinine, Ser: 1.16 mg/dL — ABNORMAL HIGH (ref 0.44–1.00)
GFR calc Af Amer: 52 mL/min — ABNORMAL LOW (ref >60)
GFR calc non Af Amer: 45 mL/min — ABNORMAL LOW (ref >60)
Glucose, Bld: 95 mg/dL (ref 70–99)
Potassium: 4.2 mmol/L (ref 3.5–5.1)
Sodium: 140 mmol/L (ref 135–145)

## 2019-05-19 LAB — PROTIME-INR
INR: 1.1 (ref 0.8–1.2)
Prothrombin Time: 13.6 seconds (ref 11.4–15.2)

## 2019-05-19 LAB — TROPONIN I (HIGH SENSITIVITY)
Troponin I (High Sensitivity): 10 ng/L (ref <18)
Troponin I (High Sensitivity): 8 ng/L (ref <18)

## 2019-05-19 LAB — BRAIN NATRIURETIC PEPTIDE: B Natriuretic Peptide: 48.6 pg/mL (ref 0.0–100.0)

## 2019-05-19 MED ORDER — ALPRAZOLAM 0.5 MG PO TABS
0.5000 mg | ORAL_TABLET | Freq: Three times a day (TID) | ORAL | Status: DC | PRN
  Administered 2019-05-20 (×2): 0.5 mg via ORAL
  Filled 2019-05-19 (×2): qty 1

## 2019-05-19 MED ORDER — AEROCHAMBER PLUS FLO-VU LARGE MISC
1.0000 | Freq: Once | Status: AC
  Administered 2019-05-19: 19:00:00 1

## 2019-05-19 MED ORDER — ACETAMINOPHEN 325 MG PO TABS
650.0000 mg | ORAL_TABLET | Freq: Four times a day (QID) | ORAL | Status: DC | PRN
  Administered 2019-05-20 – 2019-05-21 (×2): 650 mg via ORAL
  Filled 2019-05-19 (×2): qty 2

## 2019-05-19 MED ORDER — ASPIRIN 81 MG PO CHEW
81.0000 mg | CHEWABLE_TABLET | ORAL | Status: DC
  Administered 2019-05-20: 81 mg via ORAL
  Filled 2019-05-19: qty 1

## 2019-05-19 MED ORDER — ALBUTEROL SULFATE HFA 108 (90 BASE) MCG/ACT IN AERS
6.0000 | INHALATION_SPRAY | Freq: Once | RESPIRATORY_TRACT | Status: AC
  Administered 2019-05-19: 19:00:00 6 via RESPIRATORY_TRACT
  Filled 2019-05-19: qty 6.7

## 2019-05-19 MED ORDER — PRAVASTATIN SODIUM 40 MG PO TABS
80.0000 mg | ORAL_TABLET | Freq: Every evening | ORAL | Status: DC
  Administered 2019-05-19 – 2019-05-20 (×2): 80 mg via ORAL
  Filled 2019-05-19 (×2): qty 2

## 2019-05-19 MED ORDER — HEPARIN (PORCINE) 25000 UT/250ML-% IV SOLN
1500.0000 [IU]/h | INTRAVENOUS | Status: DC
  Administered 2019-05-19 – 2019-05-20 (×2): 1500 [IU]/h via INTRAVENOUS
  Filled 2019-05-19 (×2): qty 250

## 2019-05-19 MED ORDER — AEROCHAMBER PLUS FLO-VU LARGE MISC
Status: AC
  Administered 2019-05-19: 1
  Filled 2019-05-19: qty 1

## 2019-05-19 MED ORDER — HEPARIN BOLUS VIA INFUSION
5500.0000 [IU] | Freq: Once | INTRAVENOUS | Status: AC
  Administered 2019-05-19: 5500 [IU] via INTRAVENOUS
  Filled 2019-05-19: qty 5500

## 2019-05-19 MED ORDER — PAROXETINE HCL 20 MG PO TABS
20.0000 mg | ORAL_TABLET | Freq: Every day | ORAL | Status: DC
  Administered 2019-05-19 – 2019-05-20 (×2): 20 mg via ORAL
  Filled 2019-05-19 (×4): qty 1

## 2019-05-19 MED ORDER — ACETAMINOPHEN 650 MG RE SUPP: 650.0000 mg | Freq: Four times a day (QID) | RECTAL | Status: DC | PRN

## 2019-05-19 MED ORDER — HEPARIN SODIUM (PORCINE) 5000 UNIT/ML IJ SOLN: 60.0000 [IU]/kg | Freq: Once | INTRAMUSCULAR | Status: DC

## 2019-05-19 MED ORDER — IOHEXOL 350 MG/ML SOLN
80.0000 mL | Freq: Once | INTRAVENOUS | Status: AC | PRN
  Administered 2019-05-19: 80 mL via INTRAVENOUS

## 2019-05-19 MED ORDER — ALBUTEROL SULFATE HFA 108 (90 BASE) MCG/ACT IN AERS: 2.0000 | INHALATION_SPRAY | RESPIRATORY_TRACT | Status: DC | PRN

## 2019-05-19 MED ORDER — PANTOPRAZOLE SODIUM 40 MG PO TBEC
40.0000 mg | DELAYED_RELEASE_TABLET | Freq: Every day | ORAL | Status: DC
  Administered 2019-05-19 – 2019-05-21 (×3): 40 mg via ORAL
  Filled 2019-05-19 (×3): qty 1

## 2019-05-19 MED ORDER — BENZONATATE 100 MG PO CAPS
200.0000 mg | ORAL_CAPSULE | Freq: Three times a day (TID) | ORAL | Status: DC | PRN
  Administered 2019-05-20: 200 mg via ORAL
  Filled 2019-05-19: qty 2

## 2019-05-19 NOTE — ED Triage Notes (Signed)
Patient complains of 3 days of increased SOB following shoulder surgery. Patient using inhaler and cough meds with no relief. Denies swelling, no CP, speaking complete sentences

## 2019-05-19 NOTE — ED Notes (Signed)
CT called RN inform left AC IV infiltrated during CT study/ RN elevated left arm.

## 2019-05-19 NOTE — ED Notes (Signed)
ED TO INPATIENT HANDOFF REPORT  ED Nurse Name and Phone #: Heavan Francom [phone]   S Name/Age/Gender Alison York 78 y.o. female Room/Bed: 058C/058C  Code Status   Code Status: Full Code  Home/SNF/Other Home Patient oriented to: self, place, time and situation Is this baseline? Yes   Triage Complete: Triage complete  Chief Complaint sob and cough  Triage Note Patient complains of 3 days of increased SOB following shoulder surgery. Patient using inhaler and cough meds with no relief. Denies swelling, no CP, speaking complete sentences   Allergies Allergies  Allergen Reactions  . Oxycodone Shortness Of Breath  . Simvastatin Other (See Comments)    Leg pain    Level of Care/Admitting Diagnosis ED Disposition    ED Disposition Condition Comment   Admit  Hospital Area: [facility] [[id number]]  Level of Care: Telemetry Cardiac [103]  Covid Evaluation: Asymptomatic Screening Protocol (No Symptoms)  Diagnosis: Pulmonary embolism [facility]) [id number]  Admitting Physician: Orene Desanctis [id number]  Attending Physician: Orene Desanctis [id number]  Estimated length of stay: past midnight tomorrow  Certification:: I certify this patient will need inpatient services for at least 2 midnights  PT Class (Do Not Modify): Inpatient [101]  PT Acc Code (Do Not Modify): Private [1]       B Medical/Surgery History Past Medical History:  Diagnosis Date  . Anxiety state, unspecified   . Cerebral hemorrhage ([place]) 1995  . Dysphagia   . Erosive esophagitis   . Esophageal reflux   . Esophageal stricture   . Hiatal hernia   . Hypercholesterolemia   . Internal hemorrhoids   . Osteoporosis   . Personal history of colonic polyps 1999 & 11/04/2011   villous adenoma & tubular adenoma  . Prediabetes   . Restless legs syndrome (RLS)   . Vitamin D deficiency    Past Surgical History:  Procedure Laterality Date  . BREAST BIOPSY     bilateral  . CHONDROPLASTY  11/05/2010  . KNEE ARTHROSCOPY  W/ MENISCECTOMY  11/05/2010  . PARTIAL HYSTERECTOMY  age 88-35  . TONSILLECTOMY       A IV Location/Drains/Wounds Patient Lines/Drains/Airways Status   Active Line/Drains/Airways    Name:   Placement date:   Placement time:   Site:   Days:   Peripheral IV 05/19/19 Right Forearm   05/19/19    1957    Forearm   less than 1   Peripheral IV 05/19/19 Anterior;Right Wrist   05/19/19    1901    Wrist   less than 1          Intake/Output Last 24 hours No intake or output data in the 24 hours ending 05/19/19 2251  Labs/Imaging Results for orders placed or performed during the hospital encounter of 05/19/19 (from the past 48 hour(s))  CBC with Differential     Status: Abnormal   Collection Time: 05/19/19 12:57 PM  Result Value Ref Range   WBC 9.9 4.0 - 10.5 K/uL   RBC 4.48 3.87 - 5.11 MIL/uL   Hemoglobin 12.4 12.0 - 15.0 g/dL   HCT 41.4 36.0 - 46.0 %   MCV 92.4 80.0 - 100.0 fL   MCH 27.7 26.0 - 34.0 pg   MCHC 30.0 30.0 - 36.0 g/dL   RDW 15.9 (H) 11.5 - 15.5 %   Platelets 288 150 - 400 K/uL   nRBC 0.0 0.0 - 0.2 %   Neutrophils Relative % 59 %   Neutro Abs 5.8 1.7 - 7.7  K/uL   Lymphocytes Relative 25 %   Lymphs Abs 2.4 0.7 - 4.0 K/uL   Monocytes Relative 9 %   Monocytes Absolute 0.9 0.1 - 1.0 K/uL   Eosinophils Relative 5 %   Eosinophils Absolute 0.5 0.0 - 0.5 K/uL   Basophils Relative 1 %   Basophils Absolute 0.1 0.0 - 0.1 K/uL   Immature Granulocytes 1 %   Abs Immature Granulocytes 0.08 (H) 0.00 - 0.07 K/uL    Comment: Performed at [facility] [address] [place], [place] [id number]  Basic metabolic panel     Status: Abnormal   Collection Time: 05/19/19 12:57 PM  Result Value Ref Range   Sodium 140 135 - 145 mmol/L   Potassium 4.2 3.5 - 5.1 mmol/L   Chloride 106 98 - 111 mmol/L   CO2 23 22 - 32 mmol/L   Glucose, Bld 95 70 - 99 mg/dL   BUN 16 8 - 23 mg/dL   Creatinine, Ser 1.16 (H) 0.44 - 1.00 mg/dL   Calcium 8.8 (L) 8.9 - 10.3 mg/dL   GFR calc non Af Amer 45  (L) >60 mL/min   GFR calc Af Amer 52 (L) >60 mL/min   Anion gap 11 5 - 15    Comment: Performed at [facility] [address] [place], [place] [address]  Troponin I (High Sensitivity)     Status: None   Collection Time: 05/19/19  4:09 PM  Result Value Ref Range   Troponin I (High Sensitivity) 10 <18 ng/L    Comment: (NOTE) Elevated high sensitivity troponin I (hsTnI) values and significant  changes across serial measurements may suggest ACS but many other  chronic and acute conditions are known to elevate hsTnI results.  Refer to the "Links" section for chest pain algorithms and additional  guidance. Performed at [facility], [place] [address], [place], [place] [address]   Brain natriuretic peptide     Status: None   Collection Time: 05/19/19  4:16 PM  Result Value Ref Range   B Natriuretic Peptide 48.6 0.0 - 100.0 pg/mL    Comment: Performed at [facility] [address], [place], [place] [address]  Troponin I (High Sensitivity)     Status: None   Collection Time: 05/19/19  7:49 PM  Result Value Ref Range   Troponin I (High Sensitivity) 8 <18 ng/L    Comment: (NOTE) Elevated high sensitivity troponin I (hsTnI) values and significant  changes across serial measurements may suggest ACS but many other  chronic and acute conditions are known to elevate hsTnI results.  Refer to the "Links" section for chest pain algorithms and additional  guidance. Performed at [facility], [place] [address] [place], [place] [address]   Protime-INR     Status: None   Collection Time: 05/19/19  8:04 PM  Result Value Ref Range   Prothrombin Time 13.6 11.4 - 15.2 seconds   INR 1.1 0.8 - 1.2    Comment: (NOTE) INR goal varies based on device and disease states. Performed at [facility], [place] [address] [place], [place] [address]    Dg Chest 2 View  Result Date: 05/19/2019 CLINICAL DATA:  Right-sided chest pain, cough for 2-3 days with dyspnea on exertion. EXAM:  CHEST - 2 VIEW COMPARISON:  05/12/2019 FINDINGS: Lung volumes are decreased compared to the prior study there is basilar atelectasis this without consolidation or pleural effusion. Heart size mildly enlarged with signs  of atherosclerosis. Signs of right shoulder arthroplasty as before. IMPRESSION: No acute cardiopulmonary disease. Electronically Signed   By: Zetta Bills M.D.   On: 05/19/2019 13:25   Ct Angio Chest Pe W/cm &/or Wo Cm  Result Date: 05/19/2019 CLINICAL DATA:  Shortness of breath. Right shoulder surgery 1 month ago. Cough and shortness of breath since than. EXAM: CT ANGIOGRAPHY CHEST WITH CONTRAST TECHNIQUE: Multidetector CT imaging of the chest was performed using the standard protocol during bolus administration of intravenous contrast. Multiplanar CT image reconstructions and MIPs were obtained to evaluate the vascular anatomy. CONTRAST:  84mL OMNIPAQUE IOHEXOL 350 MG/ML SOLN COMPARISON:  Chest radiography same day FINDINGS: Cardiovascular: Pulmonary arterial opacification is good. Study is positive for pulmonary emboli in the right lower lobe pulmonary arterial branches. Heart size is normal. Ordinary aortic atherosclerosis is noted. Mediastinum/Nodes: No mass or lymphadenopathy. Patient does have a hiatal hernia. Lungs/Pleura: Mild emphysema and pulmonary scarring. No infiltrate, collapse or effusion. Upper Abdomen: Negative Musculoskeletal: Thoracic spinal curvature and degenerative changes. Review of the MIP images confirms the above findings. IMPRESSION: The study is positive for pulmonary emboli in the right lower lobe arterial branches. No evidence of right ventricular strain. Aortic atherosclerosis. Hiatal hernia. Electronically Signed   By: Nelson Chimes M.D.   On: 05/19/2019 19:11    Pending Labs Unresulted Labs (From admission, onward)    Start     Ordered   05/21/19 0500  Heparin level (unfractionated)  Daily,   R     05/19/19 2007   05/20/19 0500  CBC  Daily,   R      05/19/19 2007   05/20/19 [id number]  Basic metabolic panel  Tomorrow morning,   R     05/19/19 2046   05/20/19 0300  Heparin level (unfractionated)  Once-Timed,   STAT     05/19/19 2007   05/19/19 1952  SARS CORONAVIRUS 2 (TAT 6-24 HRS) Nasopharyngeal Nasopharyngeal Swab  (Asymptomatic/Tier 2 Patients Labs)  Once,   STAT    Question Answer Comment  Is this test for diagnosis or screening Screening   Symptomatic for COVID-19 as defined by CDC No   Hospitalized for COVID-19 No   Admitted to ICU for COVID-19 No   Previously tested for COVID-19 No   Resident in a congregate (group) care setting No   Employed in healthcare setting No   Pregnant No      05/19/19 1952          Vitals/Pain Today's Vitals   05/19/19 1916 05/19/19 1918 05/19/19 1920 05/19/19 2045  BP: (!) 126/49   (!) 132/58  Pulse: 73   79  Resp: (!) 26   16  Temp:      TempSrc:      SpO2: 98%   100%  Weight:   111.1 kg   Height:   5\' 5"  (1.651 m)   PainSc:  0-No pain      Isolation Precautions No active isolations  Medications Medications  heparin ADULT infusion 100 units/mL (25000 units/253mL sodium chloride 0.45%) (1,500 Units/hr Intravenous New Bag/Given 05/19/19 2036)  acetaminophen (TYLENOL) tablet 650 mg (has no administration in time range)    Or  acetaminophen (TYLENOL) suppository 650 mg (has no administration in time range)  aspirin chewable tablet 81 mg (has no administration in time range)  pravastatin (PRAVACHOL) tablet 80 mg (80 mg Oral Given 05/19/19 2142)  ALPRAZolam (XANAX) tablet 0.5 mg (has no administration in time range)  PARoxetine (PAXIL) tablet 20 mg (20  mg Oral Given 05/19/19 2143)  pantoprazole (PROTONIX) EC tablet 40 mg (40 mg Oral Given 05/19/19 2143)  albuterol (VENTOLIN HFA) 108 (90 Base) MCG/ACT inhaler 2 puff (has no administration in time range)  benzonatate (TESSALON) capsule 200 mg (has no administration in time range)  albuterol (VENTOLIN HFA) 108 (90 Base) MCG/ACT inhaler 6 puff  (6 puffs Inhalation Given 05/19/19 1919)  AeroChamber Plus Flo-Vu Large MISC 1 each (1 each Other Given 05/19/19 1920)  iohexol (OMNIPAQUE) 350 MG/ML injection 80 mL (80 mLs Intravenous Contrast Given 05/19/19 1856)  heparin bolus via infusion 5,500 Units (5,500 Units Intravenous Bolus from Bag 05/19/19 2037)    Mobility walks Low fall risk   Focused Assessments Pulmonary Assessment Handoff:  Lung sounds: Bilateral Breath Sounds: Clear L Breath Sounds: Clear R Breath Sounds: Clear O2 Device: Nasal Cannula O2 Flow Rate (L/min): 2 L/min      R Recommendations: See Admitting Provider Note  Report given to:   Additional Notes: ALERT ORIENTED X 4

## 2019-05-19 NOTE — ED Notes (Signed)
Admitting at bedside 

## 2019-05-19 NOTE — Progress Notes (Addendum)
ANTICOAGULATION CONSULT NOTE - Initial Consult  Pharmacy Consult for Heparin Indication: pulmonary embolus  Allergies  Allergen Reactions  . Oxycodone Shortness Of Breath  . Simvastatin Other (See Comments)    Leg pain    Patient Measurements: Height: 5\' 5"  (165.1 cm) Weight: 245 lb (111.1 kg) IBW/kg (Calculated) : 57 Heparin Dosing Weight: 83.2 kg  Vital Signs: Temp: 97.8 F (36.6 C) (09/23 1405) Temp Source: Oral (09/23 1405) BP: 126/49 (09/23 1916) Pulse Rate: 73 (09/23 1916)  Labs: Recent Labs    05/19/19 1257 05/19/19 1609  HGB 12.4  --   HCT 41.4  --   PLT 288  --   CREATININE 1.16*  --   TROPONINIHS  --  10    Estimated Creatinine Clearance: 49.6 mL/min (A) (by C-G formula based on SCr of 1.16 mg/dL (H)).   Medical History: Past Medical History:  Diagnosis Date  . Anxiety state, unspecified   . Cerebral hemorrhage ([place]) 1995  . Dysphagia   . Erosive esophagitis   . Esophageal reflux   . Esophageal stricture   . Hiatal hernia   . Hypercholesterolemia   . Internal hemorrhoids   . Osteoporosis   . Personal history of colonic polyps 1999 & 11/04/2011   villous adenoma & tubular adenoma  . Prediabetes   . Restless legs syndrome (RLS)   . Vitamin D deficiency     Medications:  Scheduled:  . heparin  5,500 Units Intravenous Once   Infusions:  . sodium chloride    . heparin      Assessment: 78 y.o. female presenting with SOB. PMH includes CKD stage III, GERD, dysphasia, obesity, hypercholesterolemia. Of note, hx of head bleed > 10 years ago; no reports of active bleeding. No Anticoagulation PTA. CT with PE without heart strain. Hgb 12.4, Plts 288, Scr 1.16. Pharmacy consulted for heparin dosing.  Goal of Therapy:  Heparin level 0.3-0.7 units/ml Monitor platelets by anticoagulation protocol: Yes   Plan:  Heparin bolus 5500 units Heparin gtt 1500 units/hr Check 6hr HL Daily HL and CBC Monitor for s/sx of bleeding   Lorel Monaco,  PharmD PGY1 Ambulatory Care Resident [employer] # [phone]

## 2019-05-19 NOTE — ED Provider Notes (Signed)
[facility] EMERGENCY DEPARTMENT Provider Note   CSN: [id number] Arrival date & time: 05/19/19  1239   History   Chief Complaint Chief Complaint  Patient presents with  . SOB with any exertion   HPI Alison York is a 78 y.o. female with past medical history significant for CKD, stage III, GERD, dysphasia, obesity, hypercholesterolemia, shortness of breath with exertion who presents for evaluation of shortness of breath. Patient states she has had increasing shortness of breath over the last 3 weeks. She did have shoulder surgery with Dr. Lorin Mercy.  Was told at that time that her shortness of breath was likely related to anxiety.  Patient has been seen by her PCP over the last 3 for symptoms.  They gave her steroids, albuterol inhaler without resolve her symptoms.  Given prednisone injection by her PCP yesterday.  Using albuterol inhaler without relief of her symptoms.  She has no prior history of MI, COPD, asthma, CHF.  Has had nonproductive cough.  Has lost 5 pounds since her shoulder surgery.  She denies any coagulation.  No prior history of PE or DVT however family does have history of postsurgical DVT. Denies fever, chills, nausea, vomiting, chest pain, abdominal pain, diarrhea, dysuria, new lateral weakness, redness, swelling, warmth to her extremities.  PCP- [facility] internal [facility] Cards- [facility], Phylliss Bob, NP   History obtained from patient and past medical records. No interpretor was used. HPI  Past Medical History:  Diagnosis Date  . Anxiety state, unspecified   . Cerebral hemorrhage ([place]) 1995  . Dysphagia   . Erosive esophagitis   . Esophageal reflux   . Esophageal stricture   . Hiatal hernia   . Hypercholesterolemia   . Internal hemorrhoids   . Osteoporosis   . Personal history of colonic polyps 1999 & 11/04/2011   villous adenoma & tubular adenoma  . Prediabetes   . Restless legs syndrome (RLS)   . Vitamin D deficiency     Patient Active  Problem List   Diagnosis Date Noted  . Hypercholesterolemia 02/04/2018  . CKD (chronic kidney disease) stage 3, GFR 30-59 ml/min (HCC) 02/04/2018  . Esophageal reflux 11/04/2011  . Personal history of colonic polyps 11/04/2011  . Benign neoplasm of colon 11/04/2011  . Dysphagia 10/25/2011  . History of colon polyps 10/25/2011  . GERD (gastroesophageal reflux disease) 10/25/2011  . Obesity, Class III, BMI 40-49.9 (morbid obesity) ([place]) 10/25/2011    Past Surgical History:  Procedure Laterality Date  . BREAST BIOPSY     bilateral  . CHONDROPLASTY  11/05/2010  . KNEE ARTHROSCOPY W/ MENISCECTOMY  11/05/2010  . PARTIAL HYSTERECTOMY  age 55-35  . TONSILLECTOMY       OB History   No obstetric history on file.      Home Medications    Prior to Admission medications   Medication Sig Start Date End Date Taking? Authorizing Provider  albuterol (VENTOLIN HFA) 108 (90 Base) MCG/ACT inhaler Inhale 2 puffs into the lungs every 4 (four) hours as needed for wheezing or shortness of breath.   Yes [provider]  ALPRAZolam (XANAX) 0.5 MG tablet Take 0.5 mg by mouth every 8 (eight) hours as needed for anxiety.    Yes [provider]  aspirin 81 MG tablet Take 81 mg by mouth 2 (two) times a week.    Yes [provider]  benzonatate (TESSALON) 200 MG capsule Take 200 mg by mouth 3 (three) times daily as needed for cough.   Yes  [provider]  Calcium-Vitamin D 600-125 MG-UNIT TABS Take 1 tablet by mouth 2 (two) times a week.    Yes [provider]  furosemide (LASIX) 20 MG tablet Take 20 mg by mouth 2 (two) times daily.   Yes [provider]  Multiple Vitamin (MULTIVITAMIN) capsule Take 1 capsule by mouth 2 (two) times a week.    Yes [provider]  naproxen (NAPROSYN) 500 MG tablet Take 500 mg by mouth at bedtime.  10/29/17  Yes [provider]  omeprazole (PRILOSEC) 40 MG capsule Take 40 mg by mouth daily at 3 pm.  11/05/16   Yes [provider]  PARoxetine (PAXIL) 20 MG tablet Take 20 mg by mouth daily at 3 pm.    Yes [provider]  pravastatin (PRAVACHOL) 80 MG tablet Take 1 tablet (80 mg total) by mouth every evening. 02/10/18 05/19/19 Yes Daune Perch, NP    Family History Family History  Problem Relation Age of Onset  . Hypertension Father   . Stroke Father        ministrokes start in 54's  . Tuberculosis Mother   . CAD Mother   . Breast cancer Sister   . Heart failure Sister        possible CAD  . Diabetes Sister   . Mental illness Sister   . Alzheimer's disease Sister   . Breast cancer Daughter   . Kidney cancer Brother   . Colon cancer Neg Hx     Social History Social History   Tobacco Use  . Smoking status: Former Smoker    Quit date: 08/26/1972    Years since quitting: 46.7  . Smokeless tobacco: Never Used  Substance Use Topics  . Alcohol use: No  . Drug use: No   Allergies   Oxycodone and Simvastatin  Review of Systems Review of Systems  Constitutional: Negative.   HENT: Negative.   Respiratory: Positive for cough (Non productive) and shortness of breath.   Cardiovascular: Negative.   Gastrointestinal: Negative.   Genitourinary: Negative.   Musculoskeletal: Negative.   Skin: Negative.   Neurological: Negative.   All other systems reviewed and are negative.  Physical Exam Updated Vital Signs BP (!) 126/49   Pulse 73   Temp 97.8 F (36.6 C) (Oral)   Resp (!) 26   Ht 5\' 5"  (1.651 m)   Wt 111.1 kg   SpO2 98%   BMI 40.77 kg/m   Physical Exam Vitals signs and nursing note reviewed.  Constitutional:      General: She is not in acute distress.    Appearance: She is well-developed. She is not ill-appearing, toxic-appearing or diaphoretic.  HENT:     Head: Normocephalic and atraumatic.     Nose: Nose normal.     Mouth/Throat:     Mouth: Mucous membranes are moist.     Pharynx: Oropharynx is clear.  Eyes:     Pupils: Pupils are equal, round,  and reactive to light.  Neck:     Musculoskeletal: Normal range of motion.  Cardiovascular:     Rate and Rhythm: Normal rate.     Pulses: Normal pulses.     Heart sounds: Normal heart sounds.  Pulmonary:     Effort: Pulmonary effort is normal. No respiratory distress.     Breath sounds: Normal breath sounds. No stridor. No wheezing, rhonchi or rales.     Comments: 2L [place] Chest:     Chest wall: No tenderness.  Abdominal:  General: Bowel sounds are normal. There is no distension.     Tenderness: There is no abdominal tenderness. There is no right CVA tenderness, left CVA tenderness, guarding or rebound.     Hernia: No hernia is present.  Musculoskeletal: Normal range of motion.     Comments: Moves all 4 extremities at difficulty.  No lower extremity edema, erythema or warmth.  Skin:    General: Skin is warm and dry.     Comments: Brisk cap refill.  No edema, erythema, ecchymosis or warmth.  Neurological:     Mental Status: She is alert.     Comments: Cranial nerves II through XII grossly intact.    ED Treatments / Results  Labs (all labs ordered are listed, but only abnormal results are displayed) Labs Reviewed  CBC WITH DIFFERENTIAL/PLATELET - Abnormal; Notable for the following components:      Result Value   RDW 15.9 (*)    Abs Immature Granulocytes 0.08 (*)    All other components within normal limits  BASIC METABOLIC PANEL - Abnormal; Notable for the following components:   Creatinine, Ser 1.16 (*)    Calcium 8.8 (*)    GFR calc non Af Amer 45 (*)    GFR calc Af Amer 52 (*)    All other components within normal limits  SARS CORONAVIRUS 2 (TAT 6-24 HRS)  BRAIN NATRIURETIC PEPTIDE  PROTIME-INR  CBC  HEPARIN LEVEL (UNFRACTIONATED)  TROPONIN I (HIGH SENSITIVITY)  TROPONIN I (HIGH SENSITIVITY)    EKG EKG Interpretation  Date/Time:  Wednesday May 19 2019 12:52:26 EDT Ventricular Rate:  83 PR Interval:  140 QRS Duration: 138 QT Interval:  400 QTC  Calculation: 470 R Axis:   -21 Text Interpretation:  Normal sinus rhythm Right bundle branch block Abnormal ECG Confirmed by Dene Gentry [id number]) on 05/19/2019 4:07:08 PM   Radiology Dg Chest 2 View  Result Date: 05/19/2019 CLINICAL DATA:  Right-sided chest pain, cough for 2-3 days with dyspnea on exertion. EXAM: CHEST - 2 VIEW COMPARISON:  05/12/2019 FINDINGS: Lung volumes are decreased compared to the prior study there is basilar atelectasis this without consolidation or pleural effusion. Heart size mildly enlarged with signs of atherosclerosis. Signs of right shoulder arthroplasty as before. IMPRESSION: No acute cardiopulmonary disease. Electronically Signed   By: Zetta Bills M.D.   On: 05/19/2019 13:25   Ct Angio Chest Pe W/cm &/or Wo Cm  Result Date: 05/19/2019 CLINICAL DATA:  Shortness of breath. Right shoulder surgery 1 month ago. Cough and shortness of breath since than. EXAM: CT ANGIOGRAPHY CHEST WITH CONTRAST TECHNIQUE: Multidetector CT imaging of the chest was performed using the standard protocol during bolus administration of intravenous contrast. Multiplanar CT image reconstructions and MIPs were obtained to evaluate the vascular anatomy. CONTRAST:  29mL OMNIPAQUE IOHEXOL 350 MG/ML SOLN COMPARISON:  Chest radiography same day FINDINGS: Cardiovascular: Pulmonary arterial opacification is good. Study is positive for pulmonary emboli in the right lower lobe pulmonary arterial branches. Heart size is normal. Ordinary aortic atherosclerosis is noted. Mediastinum/Nodes: No mass or lymphadenopathy. Patient does have a hiatal hernia. Lungs/Pleura: Mild emphysema and pulmonary scarring. No infiltrate, collapse or effusion. Upper Abdomen: Negative Musculoskeletal: Thoracic spinal curvature and degenerative changes. Review of the MIP images confirms the above findings. IMPRESSION: The study is positive for pulmonary emboli in the right lower lobe arterial branches. No evidence of right ventricular  strain. Aortic atherosclerosis. Hiatal hernia. Electronically Signed   By: Nelson Chimes M.D.   On: 05/19/2019 19:11  Procedures .Critical Care Performed by: Nettie Elm, PA-C Authorized by: Nettie Elm, PA-C   Critical care provider statement:    Critical care time (minutes):  45   Critical care was necessary to treat or prevent imminent or life-threatening deterioration of the following conditions:  Circulatory failure (Pulmonary embolism)   Critical care was time spent personally by me on the following activities:  Discussions with consultants, evaluation of patient's response to treatment, examination of patient, ordering and performing treatments and interventions, ordering and review of laboratory studies, ordering and review of radiographic studies, pulse oximetry, re-evaluation of patient's condition, obtaining history from patient or surrogate and review of old charts   (including critical care time)  Medications Ordered in ED Medications  heparin bolus via infusion 5,500 Units (has no administration in time range)  heparin ADULT infusion 100 units/mL (25000 units/242mL sodium chloride 0.45%) (has no administration in time range)  albuterol (VENTOLIN HFA) 108 (90 Base) MCG/ACT inhaler 6 puff (6 puffs Inhalation Given 05/19/19 1919)  AeroChamber Plus Flo-Vu Large MISC 1 each (1 each Other Given 05/19/19 1920)  iohexol (OMNIPAQUE) 350 MG/ML injection 80 mL (80 mLs Intravenous Contrast Given 05/19/19 1856)   Initial Impression / Assessment and Plan / ED Course  I have reviewed the triage vital signs and the nursing notes.  Pertinent labs & imaging results that were available during my care of the patient were reviewed by me and considered in my medical decision making (see chart for details).  78 year old female appears otherwise well presents for evaluation of shortness of breath with exertion.  Has history of same however states this is significantly worse.  Symptoms  worsened approximately 3 weeks ago after she had a right shoulder replacement.  She has a chronic nonproductive cough.  Has been seen by her PCP and trialed on steroids, albuterol therapy without resolve of symptoms.  No prior history of CHF, COPD, asthma, PE, MI.  Lungs clear.  She does not appear fluid overloaded.  No evidence of DVT on exam.  She did have episode of hypoxia and dropped her oxygen saturations 86% on room air.  She is placed 2 L nasal cannula.   Labs and imaging personally reviewed CBC without leukocytosis Metabolic panel with creatinine 1.16, GFR 45 BNP 48 Troponin 10 Plain film chest without acute findings CTA Chest PE without heart strain EKG with RBBB however similar to previous. No ST/T changes. No STEMI  1930: CT with PE without heart strain. No hx of GI bleed however hx of head bleed. Unsure of circumstances surrounding this. I do not see records of this in her ED chart however she states she was seen for this in the [facility] ED at the time. Will consult with NS for recommendations on anticoagulation given possible hx of bleed. Patient comfortable on 2 L [place] with Oxygen at 98%. No CP.  1949: Consulted with Meyran with NS who recommends starting heparin. Does not need imaging at this time given bleed >10 years ago. Heparin ordered per pharm to dose. IV did infiltrate during CT. Area without erythema or pain, Heating pack placed and extremity elevated. Compartments soft.  2000: Consulted with Dr.Tu  with [facility] who will evaluate patient for admission. Discussed plan with patient and family. All questions answered.  VS stable on 2 L [place] without tachycardia or tachypnea.  The patient appears reasonably stabilized for admission considering the current resources, flow, and capabilities available in the ED at this time, and I doubt any other [facility]  requiring further screening and/or treatment in the ED prior to admission.  Final Clinical Impressions(s) / ED Diagnoses   Final diagnoses:   Single subsegmental pulmonary embolism without acute cor pulmonale    ED Discharge Orders    None       Oluwatobiloba Martin A, PA-C 05/19/19 2012    Valarie Merino, MD 05/20/19 2245

## 2019-05-19 NOTE — H&P (Signed)
History and Physical    Alison York [id number] DOB: Apr 25, 1941 DOA: 05/19/2019  PCP: Cyndi Bender, PA-C  Patient coming from: Home and lives with husband  I have personally briefly reviewed patient's old medical records in [place]  Chief Complaint: shortness of breath with hypoxia at PCP office   HPI: Alison York is a 78 y.o. female with medical history significant of remote history of intracranial hemorrhage, chronic kidney disease stage 3, GERD, hyperlipidemia, and anxiety who presents for increasing shortness of breath following a recent shoulder surgery in August s/p fall.  Patient reports that initially following her surgery she had noticed increased shortness of breath but was told that it was due to her anxiety.  However she continued of shortness of breath as well as a new cough sometimes productive of sputum following discharge.  She was unable to do regular household chores like her dishes without exertional dyspnea.  She followed up with her PCP earlier and was prescribed a course of steroid and antibiotics as well as Lasix without improvement.  She then presented to her PCP again today and was found to be hypoxic and  recommended to be evaluated in the emergency department.  She denies any chest pain.  Denies any fever but endorses chills.  Denies any chest pain.  Denies any orthopnea, paroxysmal nocturnal dyspnea.  Denies any hemoptysis.  Denies any nausea, vomiting or abdominal pain.  Denies any lower extremity edema or calf pain.  ED Course: She was afebrile and normotensive and required 2 L via nasal cannula.CBC showed no leukocytosis or anemia.  BMP showed elevated creatinine of 1.16 which appears to be her baseline as she was 1.112 years ago.  BNP of 48.  Troponin of 10 and 8.  Chest x-ray showed no acute cardiopulmonary disease.  CTA chest revealed pulmonary emboli in the right lower lobe with no evidence of right ventricular strain.  There is also aortic  arthrosclerosis and a hiatal hernia. Patient has a remote history of intracranial hemorrhage greater than 10 years ago.  EDP has consulted neurosurgery who recommended continuing with anticoagulation due to her bleeding being remote.  She was started on heparin drip in the ED.  Review of Systems:  Constitutional: No Weight Change, No Fever ENT/Mouth: No sore throat, + Rhinorrhea (chronic) Eyes: No Eye Pain, No Vision Changes Cardiovascular: No Chest Pain, No PND, No Dyspnea on Exertion, No Orthopnea, No Edema, No Palpitations Respiratory: + Cough, + Sputum, No Wheezing, +Dyspnea  Gastrointestinal: No Nausea, No Vomiting, No Diarrhea, No Constipation, No Pain Genitourinary: no Urinary Incontinence, No Urgency, No Flank Pain Musculoskeletal: No Arthralgias, No Myalgias Skin: No Skin Lesions, No Pruritus, Neuro: no Weakness, No Numbness,  No Loss of Consciousness, No Syncope Psych:+ Anxiety, No Depression, no decrease appetite Heme/Lymph: No Bruising, No Bleeding  Past Medical History:  Diagnosis Date   Anxiety state, unspecified    Cerebral hemorrhage (HCC) 1995   Dysphagia    Erosive esophagitis    Esophageal reflux    Esophageal stricture    Hiatal hernia    Hypercholesterolemia    Internal hemorrhoids    Osteoporosis    Personal history of colonic polyps 1999 & 11/04/2011   villous adenoma & tubular adenoma   Prediabetes    Restless legs syndrome (RLS)    Vitamin D deficiency     Past Surgical History:  Procedure Laterality Date   BREAST BIOPSY     bilateral   CHONDROPLASTY  11/05/2010   KNEE  ARTHROSCOPY W/ MENISCECTOMY  11/05/2010   PARTIAL HYSTERECTOMY  age 31-35   TONSILLECTOMY       reports that she quit smoking about 46 years ago. She has never used smokeless tobacco. She reports that she does not drink alcohol or use drugs.  Allergies  Allergen Reactions   Oxycodone Shortness Of Breath   Simvastatin Other (See Comments)    Leg pain     Family History  Problem Relation Age of Onset   Hypertension Father    Stroke Father        ministrokes start in 29's   Tuberculosis Mother    CAD Mother    Breast cancer Sister    Heart failure Sister        possible CAD   Diabetes Sister    Mental illness Sister    Alzheimer's disease Sister    Breast cancer Daughter    Kidney cancer Brother    Colon cancer Neg Hx     Family history reviewed and not pertinent   Prior to Admission medications   Medication Sig Start Date End Date Taking? Authorizing Provider  albuterol (VENTOLIN HFA) 108 (90 Base) MCG/ACT inhaler Inhale 2 puffs into the lungs every 4 (four) hours as needed for wheezing or shortness of breath.   Yes [provider]  ALPRAZolam (XANAX) 0.5 MG tablet Take 0.5 mg by mouth every 8 (eight) hours as needed for anxiety.    Yes [provider]  aspirin 81 MG tablet Take 81 mg by mouth 2 (two) times a week.    Yes [provider]  benzonatate (TESSALON) 200 MG capsule Take 200 mg by mouth 3 (three) times daily as needed for cough.   Yes [provider]  Calcium-Vitamin D 600-125 MG-UNIT TABS Take 1 tablet by mouth 2 (two) times a week.    Yes [provider]  furosemide (LASIX) 20 MG tablet Take 20 mg by mouth 2 (two) times daily.   Yes [provider]  Multiple Vitamin (MULTIVITAMIN) capsule Take 1 capsule by mouth 2 (two) times a week.    Yes [provider]  naproxen (NAPROSYN) 500 MG tablet Take 500 mg by mouth at bedtime.  10/29/17  Yes [provider]  omeprazole (PRILOSEC) 40 MG capsule Take 40 mg by mouth daily at 3 pm.  11/05/16  Yes [provider]  PARoxetine (PAXIL) 20 MG tablet Take 20 mg by mouth daily at 3 pm.    Yes [provider]  pravastatin (PRAVACHOL) 80 MG tablet Take 1 tablet (80 mg total) by mouth every evening. 02/10/18 05/19/19 Yes Daune Perch, NP    Physical Exam: Well-appearing, nontoxic obese  female Vitals:   05/19/19 1915 05/19/19 1916 05/19/19 1920 05/19/19 2045  BP:  (!) 126/49    Pulse: 74 73  79  Resp: 18 (!) 26  16  Temp:      TempSrc:      SpO2: 98% 98%  100%  Weight:   111.1 kg   Height:   5\' 5"  (1.651 m)     Constitutional: NAD, calm, comfortable Vitals:   05/19/19 1915 05/19/19 1916 05/19/19 1920 05/19/19 2045  BP:  (!) 126/49    Pulse: 74 73  79  Resp: 18 (!) 26  16  Temp:      TempSrc:      SpO2: 98% 98%  100%  Weight:   111.1 kg   Height:   5\' 5"  (1.651 m)  Eyes: PERRL, lids and conjunctivae normal ENMT: Mucous membranes are moist. Posterior pharynx clear of any exudate or lesions.Normal dentition.  Neck: normal, supple, no masses Respiratory: clear to auscultation bilaterally, no wheezing, no crackles. Normal respiratory effort although sometimes has increased work of breathing when speaking. No accessory muscle use.  97% oxygen saturation on 2 L via nasal cannula. Cardiovascular: Regular rate and rhythm, 2 out of 6 systolic murmur heard best at the left sternal border. No extremity edema. 2+ pedal pulses.  Abdomen: no tenderness, no masses palpated.  Bowel sounds positive.  Musculoskeletal: no clubbing / cyanosis. No joint deformity upper and lower extremities. Good ROM, no contractures. Normal muscle tone. No calf tenderness bilaterally. Skin: no rashes, lesions, ulcers. No induration Neurologic: CN 2-12 grossly intact. Sensation intact, DTR normal. Strength 5/5 in all 4.  Psychiatric: Normal judgment and insight. Alert and oriented x 3. Normal mood.    Labs on Admission: I have personally reviewed following labs and imaging studies  CBC: Recent Labs  Lab 05/19/19 1257  WBC 9.9  NEUTROABS 5.8  HGB 12.4  HCT 41.4  MCV 92.4  PLT [id number]   Basic Metabolic Panel: Recent Labs  Lab 05/19/19 1257  NA 140  K 4.2  CL 106  CO2 23  GLUCOSE 95  BUN 16  CREATININE 1.16*  CALCIUM 8.8*   GFR: Estimated Creatinine Clearance: 49.6 mL/min (A)  (by C-G formula based on SCr of 1.16 mg/dL (H)). Liver Function Tests: No results for input(s): AST, ALT, ALKPHOS, BILITOT, PROT, ALBUMIN in the last 168 hours. No results for input(s): LIPASE, AMYLASE in the last 168 hours. No results for input(s): AMMONIA in the last 168 hours. Coagulation Profile: Recent Labs  Lab 05/19/19 2004  INR 1.1   Cardiac Enzymes: No results for input(s): CKTOTAL, CKMB, CKMBINDEX, TROPONINI in the last 168 hours. BNP (last 3 results) No results for input(s): PROBNP in the last 8760 hours. HbA1C: No results for input(s): HGBA1C in the last 72 hours. CBG: No results for input(s): GLUCAP in the last 168 hours. Lipid Profile: No results for input(s): CHOL, HDL, LDLCALC, TRIG, CHOLHDL, LDLDIRECT in the last 72 hours. Thyroid Function Tests: No results for input(s): TSH, T4TOTAL, FREET4, T3FREE, THYROIDAB in the last 72 hours. Anemia Panel: No results for input(s): VITAMINB12, FOLATE, FERRITIN, TIBC, IRON, RETICCTPCT in the last 72 hours. Urine analysis:    Component Value Date/Time   COLORURINE YELLOW 11/18/2016 1916   APPEARANCEUR HAZY (A) 11/18/2016 1916   LABSPEC 1.024 11/18/2016 1916   PHURINE 5.0 11/18/2016 1916   GLUCOSEU NEGATIVE 11/18/2016 1916   HGBUR NEGATIVE 11/18/2016 1916   BILIRUBINUR NEGATIVE 11/18/2016 1916   KETONESUR NEGATIVE 11/18/2016 1916   PROTEINUR NEGATIVE 11/18/2016 1916   NITRITE NEGATIVE 11/18/2016 1916   LEUKOCYTESUR LARGE (A) 11/18/2016 1916    Radiological Exams on Admission: Dg Chest 2 View  Result Date: 05/19/2019 CLINICAL DATA:  Right-sided chest pain, cough for 2-3 days with dyspnea on exertion. EXAM: CHEST - 2 VIEW COMPARISON:  05/12/2019 FINDINGS: Lung volumes are decreased compared to the prior study there is basilar atelectasis this without consolidation or pleural effusion. Heart size mildly enlarged with signs of atherosclerosis. Signs of right shoulder arthroplasty as before. IMPRESSION: No acute  cardiopulmonary disease. Electronically Signed   By: Zetta Bills M.D.   On: 05/19/2019 13:25   Ct Angio Chest Pe W/cm &/or Wo Cm  Result Date: 05/19/2019 CLINICAL DATA:  Shortness of breath. Right shoulder surgery 1 month ago. Cough and  shortness of breath since than. EXAM: CT ANGIOGRAPHY CHEST WITH CONTRAST TECHNIQUE: Multidetector CT imaging of the chest was performed using the standard protocol during bolus administration of intravenous contrast. Multiplanar CT image reconstructions and MIPs were obtained to evaluate the vascular anatomy. CONTRAST:  67mL OMNIPAQUE IOHEXOL 350 MG/ML SOLN COMPARISON:  Chest radiography same day FINDINGS: Cardiovascular: Pulmonary arterial opacification is good. Study is positive for pulmonary emboli in the right lower lobe pulmonary arterial branches. Heart size is normal. Ordinary aortic atherosclerosis is noted. Mediastinum/Nodes: No mass or lymphadenopathy. Patient does have a hiatal hernia. Lungs/Pleura: Mild emphysema and pulmonary scarring. No infiltrate, collapse or effusion. Upper Abdomen: Negative Musculoskeletal: Thoracic spinal curvature and degenerative changes. Review of the MIP images confirms the above findings. IMPRESSION: The study is positive for pulmonary emboli in the right lower lobe arterial branches. No evidence of right ventricular strain. Aortic atherosclerosis. Hiatal hernia. Electronically Signed   By: Nelson Chimes M.D.   On: 05/19/2019 19:11    EKG: Independently reviewed.   Assessment/Plan  Acute hypoxic respiratory failure secondary to pulmonary embolism of the right lower lobe -No right ventricular heart strain seen on CTA -Continue on heparin drip -Continue to monitor vitals closely -Keep on telemetry -Albuterol as needed as patient reports that she does get some relief with her shortness of breath with this -Continue Tessalon Perles for cough  Hyperlipidemia -Continue pravastatin  Anxiety -Continue Xanax as  needed -Continue Paxil  GERD -Continue omeprazole     DVT prophylaxis: Heparin gtt Code Status: Full Family Communication: Discussed with patient at bedside Disposition Plan: Home following 2 midnight stay secondary to acute respiratory hypoxia secondary to pulmonary embolism requiring oxygen supplementation.   Consults called: None Admission status: inpatient   Corrisa Gibby T Moncerrat Burnstein DO [facility]  If 7PM-7AM, please contact night-coverage [email or web address] Password [facility]  05/19/2019, 8:55 PM

## 2019-05-19 NOTE — ED Notes (Signed)
Daughter in-law, Leonette Weckwerth- would like to be updated on pt status.[phone]

## 2019-05-20 ENCOUNTER — Encounter (HOSPITAL_COMMUNITY): Payer: Self-pay

## 2019-05-20 DIAGNOSIS — I2693 Single subsegmental pulmonary embolism without acute cor pulmonale: Principal | ICD-10-CM

## 2019-05-20 LAB — CBC
HCT: 35.4 % — ABNORMAL LOW (ref 36.0–46.0)
Hemoglobin: 11.2 g/dL — ABNORMAL LOW (ref 12.0–15.0)
MCH: 28.2 pg (ref 26.0–34.0)
MCHC: 31.6 g/dL (ref 30.0–36.0)
MCV: 89.2 fL (ref 80.0–100.0)
Platelets: 214 10*3/uL (ref 150–400)
RBC: 3.97 MIL/uL (ref 3.87–5.11)
RDW: 15.9 % — ABNORMAL HIGH (ref 11.5–15.5)
WBC: 9.1 10*3/uL (ref 4.0–10.5)
nRBC: 0 % (ref 0.0–0.2)

## 2019-05-20 LAB — BASIC METABOLIC PANEL
Anion gap: 11 (ref 5–15)
BUN: 15 mg/dL (ref 8–23)
CO2: 24 mmol/L (ref 22–32)
Calcium: 8.7 mg/dL — ABNORMAL LOW (ref 8.9–10.3)
Chloride: 103 mmol/L (ref 98–111)
Creatinine, Ser: 0.96 mg/dL (ref 0.44–1.00)
GFR calc Af Amer: >60 mL/min (ref >60)
GFR calc non Af Amer: 57 mL/min — ABNORMAL LOW (ref >60)
Glucose, Bld: 120 mg/dL — ABNORMAL HIGH (ref 70–99)
Potassium: 3.9 mmol/L (ref 3.5–5.1)
Sodium: 138 mmol/L (ref 135–145)

## 2019-05-20 LAB — MRSA PCR SCREENING: MRSA by PCR: NEGATIVE

## 2019-05-20 LAB — HEPARIN LEVEL (UNFRACTIONATED)
Heparin Unfractionated: 0.61 IU/mL (ref 0.30–0.70)
Heparin Unfractionated: 0.63 IU/mL (ref 0.30–0.70)

## 2019-05-20 LAB — SARS CORONAVIRUS 2 (TAT 6-24 HRS): SARS Coronavirus 2: NEGATIVE

## 2019-05-20 MED ORDER — ALUM & MAG HYDROXIDE-SIMETH 200-200-20 MG/5ML PO SUSP
30.0000 mL | ORAL | Status: DC | PRN
  Administered 2019-05-20 – 2019-05-21 (×2): 30 mL via ORAL
  Filled 2019-05-20 (×2): qty 30

## 2019-05-20 MED ORDER — APIXABAN 5 MG PO TABS
10.0000 mg | ORAL_TABLET | Freq: Two times a day (BID) | ORAL | Status: DC
  Administered 2019-05-20 – 2019-05-21 (×3): 10 mg via ORAL
  Filled 2019-05-20 (×3): qty 2

## 2019-05-20 MED ORDER — ORAL CARE MOUTH RINSE
15.0000 mL | Freq: Two times a day (BID) | OROMUCOSAL | Status: DC
  Administered 2019-05-20 – 2019-05-21 (×3): 15 mL via OROMUCOSAL

## 2019-05-20 MED ORDER — APIXABAN 5 MG PO TABS: 5.0000 mg | ORAL_TABLET | Freq: Two times a day (BID) | ORAL | Status: DC

## 2019-05-20 MED ORDER — ALBUTEROL SULFATE (2.5 MG/3ML) 0.083% IN NEBU: 2.5000 mg | INHALATION_SOLUTION | RESPIRATORY_TRACT | Status: DC | PRN

## 2019-05-20 MED ORDER — PHENOL 1.4 % MT LIQD
1.0000 | OROMUCOSAL | Status: DC | PRN
  Filled 2019-05-20: qty 177

## 2019-05-20 MED ORDER — CALCIUM CARBONATE ANTACID 500 MG PO CHEW: 1.0000 | CHEWABLE_TABLET | Freq: Three times a day (TID) | ORAL | Status: DC | PRN

## 2019-05-20 NOTE — Plan of Care (Signed)

## 2019-05-20 NOTE — Progress Notes (Signed)
Hospitalist progress note   Alison York  [id number] DOB: 1941-08-22 DOA: 05/19/2019 PCP: Cyndi Bender, PA-C   Brief Narrative:  66 white female CKD 3 reflux HLD bipolar prior ICH >10 years-recent Rx PCP DOE + steroid/ABX/Lasix-no improvement-presented [facility] ED 05/19/2019-CTA chest = RLL PE-started on heparin GTT  Assessment & Plan:   RLL PE, no RV strain PESI score = class II = 70 = minimally elevated-rapid transition to OAC?--> Eliquis-she needs to ambulate in the room because she was hypoxic earlier and felt short of breath and is worried about going home right away so we will give her the first dose and transition her hopefully CKD 3-baseline 1.16 on admission currently 0.96-monitor Normocytic anemia-blood counts in the 11-12 range-outpatient repeat 3 weeks Recent R shoulder SX-outpatient follow-up with Dr. Ermelinda Das at [facility] she can continue from my perspective occupational therapy for her shoulder Prior colonic polyps Bipolar   DVT prophylaxis: On heparin GTT code Status:   Full   family Communication:   None disposition Plan: Observe overnight likely discharge a.m. 9/25   Consultants:   None  Procedures:   No  Antimicrobials:   No  Subjective: Awake coherent states that she has shortness of breath and feels winded when gets up No chest pain at this present time Eating drinking No dark tarry stool no fever  Objective: Vitals:   05/19/19 1920 05/19/19 2045 05/20/19 0007 05/20/19 0343  BP:  (!) 132/58 (!) 118/46 (!) 112/49  Pulse:  79 68 74  Resp:  16 16 (!) 21  Temp:   97.7 F (36.5 C) 98.3 F (36.8 C)  TempSrc:   Oral Oral  SpO2:  100% 99% 95%  Weight: 111.1 kg  112 kg   Height: 5\' 5"  (1.651 m)  5\' 5"  (1.651 m)     Intake/Output Summary (Last 24 hours) at 05/20/2019 0718 Last data filed at 05/20/2019 0556 Gross per 24 hour  Intake 290.13 ml  Output 1000 ml  Net -709.87 ml   Filed Weights   05/19/19 1920 05/20/19 0007  Weight: 111.1 kg  112 kg    Examination: Awake coherent obese Mallampati 4 No JVD no bruit Chest clinically clear no added sound Abdomen soft no rebound no guarding No lower extremity edema no cord Skin soft supple Neurologically intact   Data Reviewed: I have personally reviewed following labs and imaging studies  CBC: Recent Labs  Lab 05/19/19 1257 05/20/19 0455  WBC 9.9 9.1  NEUTROABS 5.8  --   HGB 12.4 11.2*  HCT 41.4 35.4*  MCV 92.4 89.2  PLT 288 [id number]   Basic Metabolic Panel: Recent Labs  Lab 05/19/19 1257 05/20/19 0455  NA 140 138  K 4.2 3.9  CL 106 103  CO2 23 24  GLUCOSE 95 120*  BUN 16 15  CREATININE 1.16* 0.96  CALCIUM 8.8* 8.7*   GFR: Estimated Creatinine Clearance: 60.2 mL/min (by C-G formula based on SCr of 0.96 mg/dL). Liver Function Tests: No results for input(s): AST, ALT, ALKPHOS, BILITOT, PROT, ALBUMIN in the last 168 hours. No results for input(s): LIPASE, AMYLASE in the last 168 hours. No results for input(s): AMMONIA in the last 168 hours. Coagulation Profile: Recent Labs  Lab 05/19/19 2004  INR 1.1   Cardiac Enzymes:  Radiology Studies: Reviewed images personally in health database   Scheduled Meds: . aspirin  81 mg Oral 2 times weekly  . mouth rinse  15 mL Mouth Rinse BID  . pantoprazole  40 mg Oral  Daily  . PARoxetine  20 mg Oral Q1500  . pravastatin  80 mg Oral QPM   Continuous Infusions: . heparin 1,500 Units/hr (05/20/19 0000)     LOS: 1 day    Time spent: [place], MD [facility]  If 7PM-7AM, please contact night-coverage [email or web address] Password [facility] 05/20/2019, 7:18 AM

## 2019-05-20 NOTE — Progress Notes (Addendum)
[place] for Heparin Indication: pulmonary embolus  Allergies  Allergen Reactions  . Oxycodone Shortness Of Breath  . Simvastatin Other (See Comments)    Leg pain    Patient Measurements: Height: 5\' 5"  (165.1 cm) Weight: 246 lb 14.6 oz (112 kg) IBW/kg (Calculated) : 57 Heparin Dosing Weight: 83.2 kg  Vital Signs: Temp: 97.6 F (36.4 C) (09/24 1139) Temp Source: Oral (09/24 1139) BP: 116/40 (09/24 1139) Pulse Rate: 70 (09/24 1139)  Labs: Recent Labs    05/19/19 1257 05/19/19 1609 05/19/19 1949 05/19/19 2004 05/20/19 0322 05/20/19 0455 05/20/19 0921  HGB 12.4  --   --   --   --  11.2*  --   HCT 41.4  --   --   --   --  35.4*  --   PLT 288  --   --   --   --  214  --   LABPROT  --   --   --  13.6  --   --   --   INR  --   --   --  1.1  --   --   --   HEPARINUNFRC  --   --   --   --  0.61  --  0.63  CREATININE 1.16*  --   --   --   --  0.96  --   TROPONINIHS  --  10 8  --   --   --   --     Estimated Creatinine Clearance: 60.2 mL/min (by C-G formula based on SCr of 0.96 mg/dL).   Medical History: Past Medical History:  Diagnosis Date  . Anxiety state, unspecified   . Cerebral hemorrhage ([place]) 1995  . Dysphagia   . Erosive esophagitis   . Esophageal reflux   . Esophageal stricture   . Hiatal hernia   . Hypercholesterolemia   . Internal hemorrhoids   . Osteoporosis   . Personal history of colonic polyps 1999 & 11/04/2011   villous adenoma & tubular adenoma  . Prediabetes   . Restless legs syndrome (RLS)   . Vitamin D deficiency     Medications:  Scheduled:  . aspirin  81 mg Oral 2 times weekly  . mouth rinse  15 mL Mouth Rinse BID  . pantoprazole  40 mg Oral Daily  . PARoxetine  20 mg Oral Q1500  . pravastatin  80 mg Oral QPM   Infusions:  . heparin 1,500 Units/hr (05/20/19 1237)    Assessment: 78 y.o. female presenting with SOB. PMH includes CKD stage III, GERD, dysphasia, obesity, hypercholesterolemia.  Of note, hx of head bleed > 10 years ago; no reports of active bleeding. No Anticoagulation PTA. CT with PE without heart strain. Hgb 12.4 > 11.2, Plts 288 > 214, Scr 1.16. Pharmacy consulted for heparin dosing.  Goal of Therapy:  Heparin level 0.3-0.7 units/ml Monitor platelets by anticoagulation protocol: Yes   Plan:  Received orders for Eliquis. Will start Eliquis 10 mg BID x 7 days, and then 5 mg BID. Will complete Eliquis education this afternoon.  Marguerite Olea, [facility] Clinical Pharmacist Phone [phone]  05/20/2019 1:21 PM

## 2019-05-20 NOTE — Discharge Instructions (Addendum)
Information on my medicine - ELIQUIS (apixaban)  This medication education was reviewed with me or my healthcare representative as part of my discharge preparation.  The pharmacist that spoke with me during my hospital stay was:  Pat Patrick, [facility]  Why was Eliquis prescribed for you? Eliquis was prescribed to treat blood clots that may have been found in the veins of your legs (deep vein thrombosis) or in your lungs (pulmonary embolism) and to reduce the risk of them occurring again.  What do You need to know about Eliquis ? The starting dose is 10 mg (two 5 mg tablets) taken TWICE daily for the FIRST SEVEN (7) DAYS, then on (enter date)  05/27/2019  the dose is reduced to ONE 5 mg tablet taken TWICE daily.  Eliquis may be taken with or without food.   Try to take the dose about the same time in the morning and in the evening. If you have difficulty swallowing the tablet whole please discuss with your pharmacist how to take the medication safely.  Take Eliquis exactly as prescribed and DO NOT stop taking Eliquis without talking to the doctor who prescribed the medication.  Stopping may increase your risk of developing a new blood clot.  Refill your prescription before you run out.  After discharge, you should have regular check-up appointments with your healthcare provider that is prescribing your Eliquis.    What do you do if you miss a dose? If a dose of ELIQUIS is not taken at the scheduled time, take it as soon as possible on the same day and twice-daily administration should be resumed. The dose should not be doubled to make up for a missed dose.  Important Safety Information A possible side effect of Eliquis is bleeding. You should call your healthcare provider right away if you experience any of the following: ? Bleeding from an injury or your nose that does not stop. ? Unusual colored urine (red or dark brown) or unusual colored stools (red or black). ? Unusual bruising  for unknown reasons. ? A serious fall or if you hit your head (even if there is no bleeding).  Some medicines may interact with Eliquis and might increase your risk of bleeding or clotting while on Eliquis. To help avoid this, consult your healthcare provider or pharmacist prior to using any new prescription or non-prescription medications, including herbals, vitamins, non-steroidal anti-inflammatory drugs (NSAIDs) and supplements.  This website has more information on Eliquis (apixaban): http://www.eliquis.com/eliquis/home

## 2019-05-20 NOTE — Progress Notes (Signed)
[place] for Heparin Indication: pulmonary embolus  Allergies  Allergen Reactions  . Oxycodone Shortness Of Breath  . Simvastatin Other (See Comments)    Leg pain    Patient Measurements: Height: 5\' 5"  (165.1 cm) Weight: 246 lb 14.6 oz (112 kg) IBW/kg (Calculated) : 57 Heparin Dosing Weight: 83.2 kg  Vital Signs: Temp: 98.3 F (36.8 C) (09/24 0343) Temp Source: Oral (09/24 0343) BP: 112/49 (09/24 0343) Pulse Rate: 74 (09/24 0343)  Labs: Recent Labs    05/19/19 1257 05/19/19 1609 05/19/19 1949 05/19/19 2004 05/20/19 0322  HGB 12.4  --   --   --   --   HCT 41.4  --   --   --   --   PLT 288  --   --   --   --   LABPROT  --   --   --  13.6  --   INR  --   --   --  1.1  --   HEPARINUNFRC  --   --   --   --  0.61  CREATININE 1.16*  --   --   --   --   TROPONINIHS  --  10 8  --   --     Estimated Creatinine Clearance: 49.8 mL/min (A) (by C-G formula based on SCr of 1.16 mg/dL (H)).   Medical History: Past Medical History:  Diagnosis Date  . Anxiety state, unspecified   . Cerebral hemorrhage ([place]) 1995  . Dysphagia   . Erosive esophagitis   . Esophageal reflux   . Esophageal stricture   . Hiatal hernia   . Hypercholesterolemia   . Internal hemorrhoids   . Osteoporosis   . Personal history of colonic polyps 1999 & 11/04/2011   villous adenoma & tubular adenoma  . Prediabetes   . Restless legs syndrome (RLS)   . Vitamin D deficiency     Medications:  Scheduled:  . aspirin  81 mg Oral 2 times weekly  . mouth rinse  15 mL Mouth Rinse BID  . pantoprazole  40 mg Oral Daily  . PARoxetine  20 mg Oral Q1500  . pravastatin  80 mg Oral QPM   Infusions:  . heparin 1,500 Units/hr (05/20/19 0000)    Assessment: 78 y.o. female presenting with SOB. PMH includes CKD stage III, GERD, dysphasia, obesity, hypercholesterolemia. Of note, hx of head bleed > 10 years ago; no reports of active bleeding. No Anticoagulation PTA. CT with  PE without heart strain. Hgb 12.4, Plts 288, Scr 1.16. Pharmacy consulted for heparin dosing.  Initial heparin level therapeutic at 0.61  Goal of Therapy:  Heparin level 0.3-0.7 units/ml Monitor platelets by anticoagulation protocol: Yes   Plan:  Continue heparin gtt at 1500 units/hr F/u 6 hour heparin level to confirm   Bertis Ruddy, PharmD Clinical Pharmacist Please check AMION for all [place] numbers 05/20/2019 4:06 AM

## 2019-05-20 NOTE — Progress Notes (Signed)
Transitions of Care Pharmacist Note  Alison York is a 78 y.o. female that has been diagnosed with a PE and will be prescribed Eliquis (apixaban) at discharge.   Patient Education: The following education on 9/24 to the patient: How to take the medication Described what the medication is Signs of bleeding Signs/symptoms of VTE and stroke  Answered their questions  Discharge Medications Plan: The patient wants to have their discharge medications filled by the Transitions of Care pharmacy rather than their usual pharmacy.  The primary doctor for this patient has been contacted to send all discharge medication prescriptions to the Transitions of Care pharmacy, the discharge orders pharmacy has been changed to the Transitions of Care pharmacy, the patient will receive a phone call regarding co-pay, and their medications will be delivered by the Transitions of Care pharmacy.   Insurance information: BIN: [id number] PCN: [id number] Group number: [id number] Member ID number: [id number] -Insurance info was obtained from [facility] in [place], [place] ([phone]) -The patients ride will bring their method of payment   Thank you,   Kennon Holter, PharmD PGY1 Ambulatory Care Pharmacy Resident [employer] Phone: [phone] May 20, 2019

## 2019-05-21 LAB — CBC
HCT: 35.9 % — ABNORMAL LOW (ref 36.0–46.0)
Hemoglobin: 11 g/dL — ABNORMAL LOW (ref 12.0–15.0)
MCH: 27.8 pg (ref 26.0–34.0)
MCHC: 30.6 g/dL (ref 30.0–36.0)
MCV: 90.9 fL (ref 80.0–100.0)
Platelets: 183 10*3/uL (ref 150–400)
RBC: 3.95 MIL/uL (ref 3.87–5.11)
RDW: 15.6 % — ABNORMAL HIGH (ref 11.5–15.5)
WBC: 7.8 10*3/uL (ref 4.0–10.5)
nRBC: 0 % (ref 0.0–0.2)

## 2019-05-21 LAB — RENAL FUNCTION PANEL
Albumin: 3.3 g/dL — ABNORMAL LOW (ref 3.5–5.0)
Anion gap: 9 (ref 5–15)
BUN: 18 mg/dL (ref 8–23)
CO2: 26 mmol/L (ref 22–32)
Calcium: 9 mg/dL (ref 8.9–10.3)
Chloride: 106 mmol/L (ref 98–111)
Creatinine, Ser: 1.05 mg/dL — ABNORMAL HIGH (ref 0.44–1.00)
GFR calc Af Amer: 59 mL/min — ABNORMAL LOW (ref >60)
GFR calc non Af Amer: 51 mL/min — ABNORMAL LOW (ref >60)
Glucose, Bld: 121 mg/dL — ABNORMAL HIGH (ref 70–99)
Phosphorus: 4.2 mg/dL (ref 2.5–4.6)
Potassium: 4.6 mmol/L (ref 3.5–5.1)
Sodium: 141 mmol/L (ref 135–145)

## 2019-05-21 LAB — HEPARIN LEVEL (UNFRACTIONATED): Heparin Unfractionated: >2.2 IU/mL — ABNORMAL HIGH (ref 0.30–0.70)

## 2019-05-21 MED ORDER — APIXABAN 5 MG PO TABS: 5.0000 mg | ORAL_TABLET | Freq: Two times a day (BID) | ORAL | 6 refills | Status: DC

## 2019-05-21 MED ORDER — APIXABAN 5 MG PO TABS: 10.0000 mg | ORAL_TABLET | Freq: Two times a day (BID) | ORAL | 0 refills | Status: DC

## 2019-05-21 MED FILL — ELIQUIS STARTER PACK 5 MG T: 5 | 30 days supply | Qty: 74 | Fill #0

## 2019-05-21 NOTE — Discharge Summary (Addendum)
Physician Discharge Summary  Alison York [id number] DOB: April 15, 1941 DOA: 05/19/2019  PCP: Cyndi Bender, PA-C  Admit date: 05/19/2019 Discharge date: 05/21/2019  Time spent: 33 minutes  Recommendations for Outpatient Follow-up:  1. Needs Eliquis probably 6 months-note change in dose on 10/1 to lower dose-recommend aspirin 81 mg after 6 months have elapsed-would stop aspirin at this time 2. 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 Recommend outpatient CBC, Chem-12, magnesium 1 4. Also recommend outpatient pulmonology referral for CHRONIC RESPIRATORY FAILURE WITH HYPOXIA 2/2 restrictive lung disease ande OSA which needs to be tested with an Epworth or STOP-BANG in the outpatient setting  Discharge Diagnoses:  Active Problems:   Pulmonary embolism [facility])   Discharge Condition: Improved  Diet recommendation: Heart healthy  Filed Weights   05/19/19 1920 05/20/19 0007  Weight: 111.1 kg 112 kg    History of present illness:  89 white female CKD 3 reflux HLD bipolar prior ICH >10 years-recent Rx PCP DOE + steroid/ABX/Lasix-no improvement-presented [facility] ED 05/19/2019-CTA chest = RLL PE-started on heparin GTT  Hospital Course:  RLL PE, no RV strain PESI score = class II = 70 = minimally elevated-rapid transition to OAC?--> Eliquis-she needs to ambulate in the room because she was hypoxic earlier and felt short of breath and is worried about going home right away so we will give her the first dose and transition her hopefully CKD 3-baseline 1-1.5 and stabilize during hospital stay Normocytic anemia-blood counts in the 11-12 range-outpatient repeat 3 weeks Recent R shoulder SX-outpatient follow-up with Dr. Joya Salm at [facility] she can continue from my perspective occupational therapy for her shoulder as an outpatient Prior colonic polyps Bipolar  acute hypoxic respiratory  failure new oxygen requirement likely secondary to multifactorial respiratory failure from patient having restrictive lung disease in addition to small PE-requiring 3L O2 with ambulation-outpatient follow-up  Procedures:  CT chest (i.e. Studies not automatically included, echos, thoracentesis, etc; not x-rays)  Consultations:  None  Discharge Exam: Vitals:   05/21/19 0401 05/21/19 0741  BP: (!) 101/47 (!) 124/48  Pulse: 67   Resp: (!) 24 20  Temp: 97.9 F (36.6 C) 98.2 F (36.8 C)  SpO2: 97%     General: Awake alert coherent no distress eating drinking felt short of breath however on moving around with her arm she has some pain although this is manageable Cardiovascular: S1-S2 no murmur rub or gallop some SVT which is very episodic Respiratory: Clinically clear no rales no rhonchi No lower extremity edema Abdomen soft nontender no rebound or guarding  Discharge Instructions   Discharge Instructions    Call MD for:  persistant nausea and vomiting   Complete by: As directed    Call MD for:  temperature >100.4   Complete by: As directed    Diet - low sodium heart healthy   Complete by: As directed    Discharge instructions   Complete by: As directed    Patient requires use of Eliquis at the mention doses above and downward titration on 10/1 to the lower dose-recommend 6 months treatment for lung clots and recommend outpatient management and discussion about starting aspirin after the 57-month period of time is elapsed Would recommend resumption of all other meds- Will need a blood count as well as a chemistry panel by her outpatient physician   For home use only DME oxygen   Complete by: As directed  Length of Need: Lifetime   Mode or (Route): Nasal cannula   Liters per Minute: 3   Oxygen conserving device: No   Oxygen delivery system: Gas   Increase activity slowly   Complete by: As directed      Allergies as of 05/21/2019      Reactions   Oxycodone Shortness Of  Breath   Simvastatin Other (See Comments)   Leg pain      Medication List    STOP taking these medications   aspirin 81 MG tablet     TAKE these medications   ALPRAZolam 0.5 MG tablet Commonly known as: XANAX Take 0.5 mg by mouth every 8 (eight) hours as needed for anxiety.   apixaban 5 MG Tabs tablet Commonly known as: ELIQUIS Take 2 tablets (10 mg total) by mouth 2 (two) times daily for 7 days.   apixaban 5 MG Tabs tablet Commonly known as: ELIQUIS Take 1 tablet (5 mg total) by mouth 2 (two) times daily. Start taking on: May 27, 2019   benzonatate 200 MG capsule Commonly known as: TESSALON Take 200 mg by mouth 3 (three) times daily as needed for cough.   Calcium-Vitamin D 600-125 MG-UNIT Tabs Take 1 tablet by mouth 2 (two) times a week.   furosemide 20 MG tablet Commonly known as: LASIX Take 20 mg by mouth 2 (two) times daily.   multivitamin capsule Take 1 capsule by mouth 2 (two) times a week.   naproxen 500 MG tablet Commonly known as: NAPROSYN Take 500 mg by mouth at bedtime.   omeprazole 40 MG capsule Commonly known as: PRILOSEC Take 40 mg by mouth daily at 3 pm.   PARoxetine 20 MG tablet Commonly known as: PAXIL Take 20 mg by mouth daily at 3 pm.   pravastatin 80 MG tablet Commonly known as: PRAVACHOL Take 1 tablet (80 mg total) by mouth every evening.   Ventolin HFA 108 (90 Base) MCG/ACT inhaler Generic drug: albuterol Inhale 2 puffs into the lungs every 4 (four) hours as needed for wheezing or shortness of breath.            Durable Medical Equipment  (From admission, onward)         Start     Ordered   05/21/19 0000  For home use only DME oxygen    Question Answer Comment  Length of Need Lifetime   Mode or (Route) Nasal cannula   Liters per Minute 3   Oxygen conserving device No   Oxygen delivery system Gas      05/21/19 0933         Allergies  Allergen Reactions  . Oxycodone Shortness Of Breath  . Simvastatin Other  (See Comments)    Leg pain      The results of significant diagnostics from this hospitalization (including imaging, microbiology, ancillary and laboratory) are listed below for reference.    Significant Diagnostic Studies: Dg Chest 2 View  Result Date: 05/19/2019 CLINICAL DATA:  Right-sided chest pain, cough for 2-3 days with dyspnea on exertion. EXAM: CHEST - 2 VIEW COMPARISON:  05/12/2019 FINDINGS: Lung volumes are decreased compared to the prior study there is basilar atelectasis this without consolidation or pleural effusion. Heart size mildly enlarged with signs of atherosclerosis. Signs of right shoulder arthroplasty as before. IMPRESSION: No acute cardiopulmonary disease. Electronically Signed   By: Zetta Bills M.D.   On: 05/19/2019 13:25   Ct Angio Chest Pe W/cm &/or Wo Cm  Result Date: 05/19/2019 CLINICAL  DATA:  Shortness of breath. Right shoulder surgery 1 month ago. Cough and shortness of breath since than. EXAM: CT ANGIOGRAPHY CHEST WITH CONTRAST TECHNIQUE: Multidetector CT imaging of the chest was performed using the standard protocol during bolus administration of intravenous contrast. Multiplanar CT image reconstructions and MIPs were obtained to evaluate the vascular anatomy. CONTRAST:  33mL OMNIPAQUE IOHEXOL 350 MG/ML SOLN COMPARISON:  Chest radiography same day FINDINGS: Cardiovascular: Pulmonary arterial opacification is good. Study is positive for pulmonary emboli in the right lower lobe pulmonary arterial branches. Heart size is normal. Ordinary aortic atherosclerosis is noted. Mediastinum/Nodes: No mass or lymphadenopathy. Patient does have a hiatal hernia. Lungs/Pleura: Mild emphysema and pulmonary scarring. No infiltrate, collapse or effusion. Upper Abdomen: Negative Musculoskeletal: Thoracic spinal curvature and degenerative changes. Review of the MIP images confirms the above findings. IMPRESSION: The study is positive for pulmonary emboli in the right lower lobe arterial  branches. No evidence of right ventricular strain. Aortic atherosclerosis. Hiatal hernia. Electronically Signed   By: Nelson Chimes M.D.   On: 05/19/2019 19:11    Microbiology: Recent Results (from the past 240 hour(s))  SARS CORONAVIRUS 2 (TAT 6-24 HRS) Nasopharyngeal Nasopharyngeal Swab     Status: None   Collection Time: 05/19/19  7:52 PM   Specimen: Nasopharyngeal Swab  Result Value Ref Range Status   SARS Coronavirus 2 NEGATIVE NEGATIVE Final    Comment: (NOTE) SARS-CoV-2 target nucleic acids are NOT DETECTED. The SARS-CoV-2 RNA is generally detectable in upper and lower respiratory specimens during the acute phase of infection. Negative results do not preclude SARS-CoV-2 infection, do not rule out co-infections with other pathogens, and should not be used as the sole basis for treatment or other patient management decisions. Negative results must be combined with clinical observations, patient history, and epidemiological information. The expected result is Negative. Fact Sheet for Patients: SugarRoll.be Fact Sheet for Healthcare Providers: https://www.woods-mathews.com/ This test is not yet approved or cleared by the [place] FDA and  has been authorized for detection and/or diagnosis of SARS-CoV-2 by FDA under an Emergency Use Authorization (EUA). This EUA will remain  in effect (meaning this test can be used) for the duration of the COVID-19 declaration under Section 56 4(b)(1) of the Act, 21 U.S.C. section 360bbb-3(b)(1), unless the authorization is terminated or revoked sooner. Performed at [facility], [place] [address] [place], [place] [address]   MRSA PCR Screening     Status: None   Collection Time: 05/20/19  1:35 AM   Specimen: Nasal Mucosa; Nasopharyngeal  Result Value Ref Range Status   MRSA by PCR NEGATIVE NEGATIVE Final    Comment:        The GeneXpert MRSA Assay (FDA approved for NASAL specimens only), is  one component of a comprehensive MRSA colonization surveillance program. It is not intended to diagnose MRSA infection nor to guide or monitor treatment for MRSA infections. Performed at [facility], [place] [address] [place], [place] [address]      Labs: Basic Metabolic Panel: Recent Labs  Lab 05/19/19 1257 05/20/19 0455 05/21/19 0229  NA 140 138 141  K 4.2 3.9 4.6  CL 106 103 106  CO2 23 24 26   GLUCOSE 95 120* 121*  BUN 16 15 18   CREATININE 1.16* 0.96 1.05*  CALCIUM 8.8* 8.7* 9.0  PHOS  --   --  4.2   Liver Function Tests: Recent Labs  Lab 05/21/19 0229  ALBUMIN 3.3*   No results for input(s): LIPASE, AMYLASE  in the last 168 hours. No results for input(s): AMMONIA in the last 168 hours. CBC: Recent Labs  Lab 05/19/19 1257 05/20/19 0455 05/21/19 0229  WBC 9.9 9.1 7.8  NEUTROABS 5.8  --   --   HGB 12.4 11.2* 11.0*  HCT 41.4 35.4* 35.9*  MCV 92.4 89.2 90.9  PLT 288 214 183   Cardiac Enzymes: No results for input(s): CKTOTAL, CKMB, CKMBINDEX, TROPONINI in the last 168 hours. BNP: BNP (last 3 results) Recent Labs    05/19/19 1616  BNP 48.6    ProBNP (last 3 results) No results for input(s): PROBNP in the last 8760 hours.  CBG: No results for input(s): GLUCAP in the last 168 hours.     Signed:  Nita Sells MD   [facility] 05/21/2019, 9:33 AM

## 2019-05-21 NOTE — TOC Transition Note (Addendum)
Transition of Care [facility]) - CM/SW Discharge Note   Patient Details  Name: Alison York MRN: [id number] Date of Birth: 1941/04/21  Transition of Care [facility]) CM/SW Contact:  Zenon Mayo, RN Phone Number: 05/21/2019, 11:09 AM   Clinical Narrative:    Patient for dc home today, she will need HHRN, PT, OT set up, NCM spoke with patient, she states she has no preference for [facility] agency.  NCM made referral to [facility] with [facility], awaiting to hear back .  Also benefit check for eliquis in process, [facility] will fill first 30 day free. She will also need home oxygen, no preference of agency per patient.  NCM made referral to [facility] with adapt.  Awaiting sats.  NCM received call from [facility], stating they can not take patient, will try another agency. Cory with [facility] was able to take referral . NCM informed patient about eliquis, TOC did 30 day free and refills for 30 day supply is 45.00 and [facility].   Final next level of care: [name] Barriers to Discharge: No Barriers Identified   Patient Goals and CMS Choice Patient states their goals for this hospitalization and ongoing recovery are:: go home CMS Medicare.gov Compare Post Acute Care list provided to:: Patient Choice offered to / list presented to : Patient  Discharge Placement                       Discharge Plan and Services                DME Arranged: Oxygen DME Agency: [employer] Date DME Agency Contacted: 05/21/19 Time DME Agency Contacted: 63 Representative spoke with at DME Agency: [facility] HH Arranged: RN, PT, OT [place] Agency: [facility] [employer] Date [place]: 05/21/19 Time [place]: 1108 Representative spoke with at [place]: [place]) Interventions     Readmission Risk Interventions No flowsheet data found.

## 2019-05-21 NOTE — Progress Notes (Signed)
SATURATION QUALIFICATIONS: (This note is used to comply with regulatory documentation for home oxygen)  Patient Saturations on Room Air at Rest = 90  Patient Saturations on Room Air while Ambulating = 86  Patient Saturations on 3 Liters of oxygen while Ambulating = 96  Please briefly explain why patient needs home oxygen:  Patient has a PE and has activity intolerance. Patient desaturates with activity.

## 2019-05-21 NOTE — Plan of Care (Signed)

## 2019-05-21 NOTE — TOC Benefit Eligibility Note (Signed)
Transition of Care [facility]) Benefit Eligibility Note    Patient Details  Name: [name] MRN: [id number] Date of Birth: [birth year]   Medication/Dose: Eliquis  Covered?: Yes    Prescription Coverage [facility]  Spoke with Person/Company/Phone Number:: [employer]  Co-Pay: $45 for 30 day retail  Prior Approval: No     [place] Phone Number: 05/21/2019, 12:03 PM

## 2019-05-25 DIAGNOSIS — M25611 Stiffness of right shoulder, not elsewhere classified: Secondary | ICD-10-CM | POA: Diagnosis not present

## 2019-05-25 DIAGNOSIS — M25511 Pain in right shoulder: Secondary | ICD-10-CM | POA: Diagnosis not present

## 2019-05-25 DIAGNOSIS — M6281 Muscle weakness (generalized): Secondary | ICD-10-CM | POA: Diagnosis not present

## 2019-05-26 DIAGNOSIS — J449 Chronic obstructive pulmonary disease, unspecified: Secondary | ICD-10-CM | POA: Diagnosis not present

## 2019-05-26 DIAGNOSIS — J9611 Chronic respiratory failure with hypoxia: Secondary | ICD-10-CM | POA: Diagnosis not present

## 2019-05-28 DIAGNOSIS — M25511 Pain in right shoulder: Secondary | ICD-10-CM | POA: Diagnosis not present

## 2019-05-28 DIAGNOSIS — I2699 Other pulmonary embolism without acute cor pulmonale: Secondary | ICD-10-CM | POA: Diagnosis not present

## 2019-05-28 DIAGNOSIS — M6281 Muscle weakness (generalized): Secondary | ICD-10-CM | POA: Diagnosis not present

## 2019-05-28 DIAGNOSIS — M25611 Stiffness of right shoulder, not elsewhere classified: Secondary | ICD-10-CM | POA: Diagnosis not present

## 2019-05-28 DIAGNOSIS — J9601 Acute respiratory failure with hypoxia: Secondary | ICD-10-CM | POA: Diagnosis not present

## 2019-05-28 DIAGNOSIS — R05 Cough: Secondary | ICD-10-CM | POA: Diagnosis not present

## 2019-05-28 DIAGNOSIS — G4700 Insomnia, unspecified: Secondary | ICD-10-CM | POA: Diagnosis not present

## 2019-05-28 DIAGNOSIS — Z6841 Body Mass Index (BMI) 40.0 and over, adult: Secondary | ICD-10-CM | POA: Diagnosis not present

## 2019-05-28 DIAGNOSIS — S42301A Unspecified fracture of shaft of humerus, right arm, initial encounter for closed fracture: Secondary | ICD-10-CM | POA: Diagnosis not present

## 2019-06-01 DIAGNOSIS — M25611 Stiffness of right shoulder, not elsewhere classified: Secondary | ICD-10-CM | POA: Diagnosis not present

## 2019-06-01 DIAGNOSIS — M6281 Muscle weakness (generalized): Secondary | ICD-10-CM | POA: Diagnosis not present

## 2019-06-01 DIAGNOSIS — M25511 Pain in right shoulder: Secondary | ICD-10-CM | POA: Diagnosis not present

## 2019-06-03 DIAGNOSIS — M25611 Stiffness of right shoulder, not elsewhere classified: Secondary | ICD-10-CM | POA: Diagnosis not present

## 2019-06-03 DIAGNOSIS — M25511 Pain in right shoulder: Secondary | ICD-10-CM | POA: Diagnosis not present

## 2019-06-03 DIAGNOSIS — M6281 Muscle weakness (generalized): Secondary | ICD-10-CM | POA: Diagnosis not present

## 2019-06-09 DIAGNOSIS — S42201A Unspecified fracture of upper end of right humerus, initial encounter for closed fracture: Secondary | ICD-10-CM | POA: Diagnosis not present

## 2019-06-14 DIAGNOSIS — M25611 Stiffness of right shoulder, not elsewhere classified: Secondary | ICD-10-CM | POA: Diagnosis not present

## 2019-06-14 DIAGNOSIS — M6281 Muscle weakness (generalized): Secondary | ICD-10-CM | POA: Diagnosis not present

## 2019-06-14 DIAGNOSIS — M25511 Pain in right shoulder: Secondary | ICD-10-CM | POA: Diagnosis not present

## 2019-06-20 DIAGNOSIS — J9611 Chronic respiratory failure with hypoxia: Secondary | ICD-10-CM | POA: Diagnosis not present

## 2019-06-20 DIAGNOSIS — J449 Chronic obstructive pulmonary disease, unspecified: Secondary | ICD-10-CM | POA: Diagnosis not present

## 2019-06-21 DIAGNOSIS — M25511 Pain in right shoulder: Secondary | ICD-10-CM | POA: Diagnosis not present

## 2019-06-21 DIAGNOSIS — M6281 Muscle weakness (generalized): Secondary | ICD-10-CM | POA: Diagnosis not present

## 2019-06-21 DIAGNOSIS — M25611 Stiffness of right shoulder, not elsewhere classified: Secondary | ICD-10-CM | POA: Diagnosis not present

## 2019-06-25 DIAGNOSIS — M6281 Muscle weakness (generalized): Secondary | ICD-10-CM | POA: Diagnosis not present

## 2019-06-25 DIAGNOSIS — M25511 Pain in right shoulder: Secondary | ICD-10-CM | POA: Diagnosis not present

## 2019-06-25 DIAGNOSIS — M25611 Stiffness of right shoulder, not elsewhere classified: Secondary | ICD-10-CM | POA: Diagnosis not present

## 2019-06-25 DIAGNOSIS — J9611 Chronic respiratory failure with hypoxia: Secondary | ICD-10-CM | POA: Diagnosis not present

## 2019-06-25 DIAGNOSIS — J449 Chronic obstructive pulmonary disease, unspecified: Secondary | ICD-10-CM | POA: Diagnosis not present

## 2019-06-28 DIAGNOSIS — J9601 Acute respiratory failure with hypoxia: Secondary | ICD-10-CM | POA: Diagnosis not present

## 2019-06-28 DIAGNOSIS — G2581 Restless legs syndrome: Secondary | ICD-10-CM | POA: Diagnosis not present

## 2019-06-28 DIAGNOSIS — I2699 Other pulmonary embolism without acute cor pulmonale: Secondary | ICD-10-CM | POA: Diagnosis not present

## 2019-06-28 DIAGNOSIS — Z6841 Body Mass Index (BMI) 40.0 and over, adult: Secondary | ICD-10-CM | POA: Diagnosis not present

## 2019-06-28 DIAGNOSIS — S42301A Unspecified fracture of shaft of humerus, right arm, initial encounter for closed fracture: Secondary | ICD-10-CM | POA: Diagnosis not present

## 2019-06-28 DIAGNOSIS — M810 Age-related osteoporosis without current pathological fracture: Secondary | ICD-10-CM | POA: Diagnosis not present

## 2019-06-29 DIAGNOSIS — M6281 Muscle weakness (generalized): Secondary | ICD-10-CM | POA: Diagnosis not present

## 2019-06-29 DIAGNOSIS — M25511 Pain in right shoulder: Secondary | ICD-10-CM | POA: Diagnosis not present

## 2019-06-29 DIAGNOSIS — M25611 Stiffness of right shoulder, not elsewhere classified: Secondary | ICD-10-CM | POA: Diagnosis not present

## 2019-07-02 DIAGNOSIS — M25511 Pain in right shoulder: Secondary | ICD-10-CM | POA: Diagnosis not present

## 2019-07-02 DIAGNOSIS — M6281 Muscle weakness (generalized): Secondary | ICD-10-CM | POA: Diagnosis not present

## 2019-07-02 DIAGNOSIS — M25611 Stiffness of right shoulder, not elsewhere classified: Secondary | ICD-10-CM | POA: Diagnosis not present

## 2019-07-05 DIAGNOSIS — M6281 Muscle weakness (generalized): Secondary | ICD-10-CM | POA: Diagnosis not present

## 2019-07-05 DIAGNOSIS — M25511 Pain in right shoulder: Secondary | ICD-10-CM | POA: Diagnosis not present

## 2019-07-05 DIAGNOSIS — M25611 Stiffness of right shoulder, not elsewhere classified: Secondary | ICD-10-CM | POA: Diagnosis not present

## 2019-07-20 DIAGNOSIS — H5203 Hypermetropia, bilateral: Secondary | ICD-10-CM | POA: Diagnosis not present

## 2019-07-21 DIAGNOSIS — J9611 Chronic respiratory failure with hypoxia: Secondary | ICD-10-CM | POA: Diagnosis not present

## 2019-07-21 DIAGNOSIS — J449 Chronic obstructive pulmonary disease, unspecified: Secondary | ICD-10-CM | POA: Diagnosis not present

## 2019-07-26 DIAGNOSIS — J449 Chronic obstructive pulmonary disease, unspecified: Secondary | ICD-10-CM | POA: Diagnosis not present

## 2019-07-26 DIAGNOSIS — S42201A Unspecified fracture of upper end of right humerus, initial encounter for closed fracture: Secondary | ICD-10-CM | POA: Diagnosis not present

## 2019-07-26 DIAGNOSIS — J9611 Chronic respiratory failure with hypoxia: Secondary | ICD-10-CM | POA: Diagnosis not present

## 2019-08-09 DIAGNOSIS — H25812 Combined forms of age-related cataract, left eye: Secondary | ICD-10-CM | POA: Diagnosis not present

## 2019-08-09 DIAGNOSIS — H2512 Age-related nuclear cataract, left eye: Secondary | ICD-10-CM | POA: Diagnosis not present

## 2019-08-13 DIAGNOSIS — Z79899 Other long term (current) drug therapy: Secondary | ICD-10-CM | POA: Diagnosis not present

## 2019-08-13 DIAGNOSIS — F419 Anxiety disorder, unspecified: Secondary | ICD-10-CM | POA: Diagnosis not present

## 2019-08-13 DIAGNOSIS — E7800 Pure hypercholesterolemia, unspecified: Secondary | ICD-10-CM | POA: Diagnosis not present

## 2019-08-13 DIAGNOSIS — M810 Age-related osteoporosis without current pathological fracture: Secondary | ICD-10-CM | POA: Diagnosis not present

## 2019-08-13 DIAGNOSIS — K219 Gastro-esophageal reflux disease without esophagitis: Secondary | ICD-10-CM | POA: Diagnosis not present

## 2019-08-13 DIAGNOSIS — Z6841 Body Mass Index (BMI) 40.0 and over, adult: Secondary | ICD-10-CM | POA: Diagnosis not present

## 2019-08-13 DIAGNOSIS — N183 Chronic kidney disease, stage 3 unspecified: Secondary | ICD-10-CM | POA: Diagnosis not present

## 2019-08-13 DIAGNOSIS — I2699 Other pulmonary embolism without acute cor pulmonale: Secondary | ICD-10-CM | POA: Diagnosis not present

## 2019-08-13 DIAGNOSIS — G2581 Restless legs syndrome: Secondary | ICD-10-CM | POA: Diagnosis not present

## 2019-08-23 DIAGNOSIS — H2512 Age-related nuclear cataract, left eye: Secondary | ICD-10-CM | POA: Diagnosis not present

## 2019-08-23 DIAGNOSIS — H2511 Age-related nuclear cataract, right eye: Secondary | ICD-10-CM | POA: Diagnosis not present

## 2019-09-18 DIAGNOSIS — Z20828 Contact with and (suspected) exposure to other viral communicable diseases: Secondary | ICD-10-CM | POA: Diagnosis not present

## 2019-10-20 DIAGNOSIS — S42201A Unspecified fracture of upper end of right humerus, initial encounter for closed fracture: Secondary | ICD-10-CM | POA: Diagnosis not present

## 2019-11-24 DIAGNOSIS — Z20828 Contact with and (suspected) exposure to other viral communicable diseases: Secondary | ICD-10-CM | POA: Diagnosis not present

## 2019-11-24 DIAGNOSIS — Z20822 Contact with and (suspected) exposure to covid-19: Secondary | ICD-10-CM | POA: Diagnosis not present

## 2020-01-14 DIAGNOSIS — M810 Age-related osteoporosis without current pathological fracture: Secondary | ICD-10-CM | POA: Diagnosis not present

## 2020-01-14 DIAGNOSIS — F419 Anxiety disorder, unspecified: Secondary | ICD-10-CM | POA: Diagnosis not present

## 2020-01-14 DIAGNOSIS — M5416 Radiculopathy, lumbar region: Secondary | ICD-10-CM | POA: Diagnosis not present

## 2020-01-14 DIAGNOSIS — Z6841 Body Mass Index (BMI) 40.0 and over, adult: Secondary | ICD-10-CM | POA: Diagnosis not present

## 2020-01-14 DIAGNOSIS — G2581 Restless legs syndrome: Secondary | ICD-10-CM | POA: Diagnosis not present

## 2020-01-20 DIAGNOSIS — M85851 Other specified disorders of bone density and structure, right thigh: Secondary | ICD-10-CM | POA: Diagnosis not present

## 2020-01-20 DIAGNOSIS — Z1231 Encounter for screening mammogram for malignant neoplasm of breast: Secondary | ICD-10-CM | POA: Diagnosis not present

## 2020-01-20 DIAGNOSIS — M85852 Other specified disorders of bone density and structure, left thigh: Secondary | ICD-10-CM | POA: Diagnosis not present

## 2020-02-11 DIAGNOSIS — M5416 Radiculopathy, lumbar region: Secondary | ICD-10-CM | POA: Diagnosis not present

## 2020-02-11 DIAGNOSIS — F419 Anxiety disorder, unspecified: Secondary | ICD-10-CM | POA: Diagnosis not present

## 2020-02-11 DIAGNOSIS — Z6841 Body Mass Index (BMI) 40.0 and over, adult: Secondary | ICD-10-CM | POA: Diagnosis not present

## 2020-02-11 DIAGNOSIS — Z79899 Other long term (current) drug therapy: Secondary | ICD-10-CM | POA: Diagnosis not present

## 2020-02-11 DIAGNOSIS — M810 Age-related osteoporosis without current pathological fracture: Secondary | ICD-10-CM | POA: Diagnosis not present

## 2020-02-11 DIAGNOSIS — N183 Chronic kidney disease, stage 3 unspecified: Secondary | ICD-10-CM | POA: Diagnosis not present

## 2020-02-11 DIAGNOSIS — G2581 Restless legs syndrome: Secondary | ICD-10-CM | POA: Diagnosis not present

## 2020-02-11 DIAGNOSIS — K219 Gastro-esophageal reflux disease without esophagitis: Secondary | ICD-10-CM | POA: Diagnosis not present

## 2020-02-11 DIAGNOSIS — E7800 Pure hypercholesterolemia, unspecified: Secondary | ICD-10-CM | POA: Diagnosis not present

## 2020-02-15 IMAGING — CT CT ANGIO CHEST
2 of 6 series · 18 of 46 positions shown · IV contrast (APPLIED)
Comparison: Chest radiography same day

CLINICAL DATA: Shortness of breath. Right shoulder surgery 1 month
ago. Cough and shortness of breath since than.

EXAM:
CT ANGIOGRAPHY CHEST WITH CONTRAST
TECHNIQUE: Multidetector CT imaging of the chest was performed using the
standard protocol during bolus administration of intravenous
contrast. Multiplanar CT image reconstructions and MIPs were
obtained to evaluate the vascular anatomy.
CONTRAST:  80mL OMNIPAQUE IOHEXOL 350 MG/ML SOLN

[Series 9: thins · axial · 0.86mm/px · z∈[-206,+80]mm · 15 of 314 slices shown]
[im 14/314  lung]
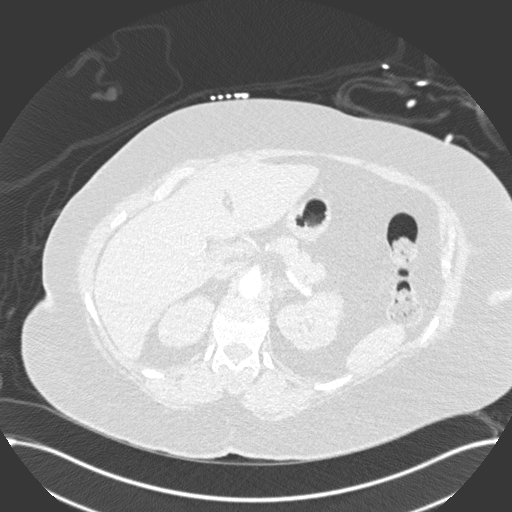
[im 41/314  soft-tissue]
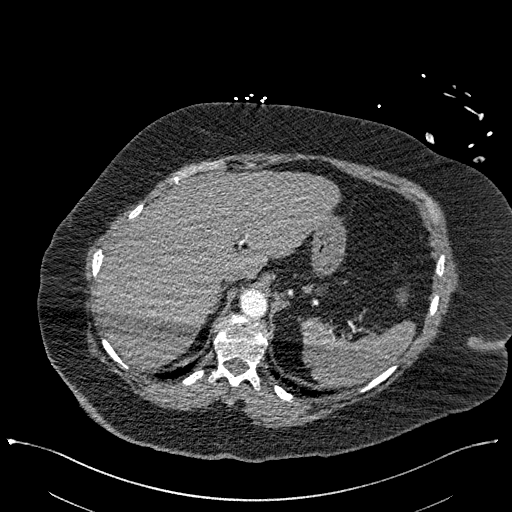
[im 55/314  lung]
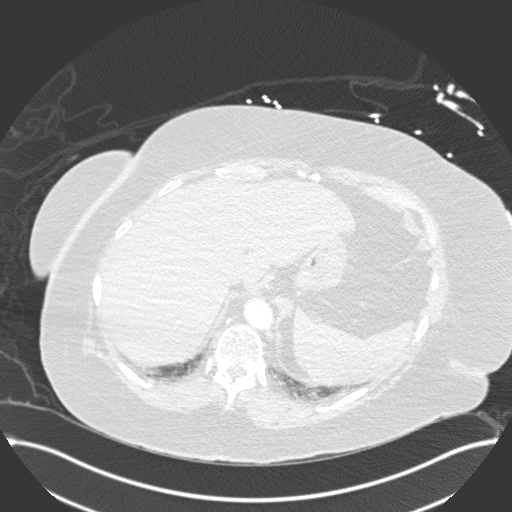
[im 82/314  soft-tissue]
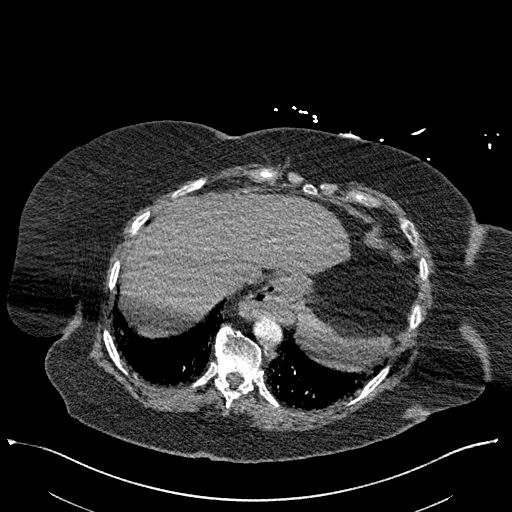
[im 96/314  lung]
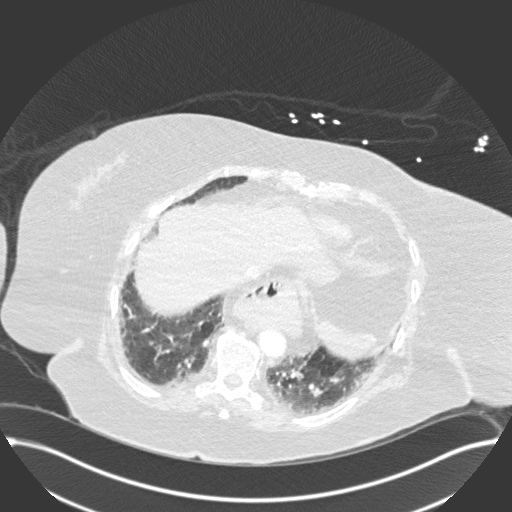
[im 123/314  soft-tissue]
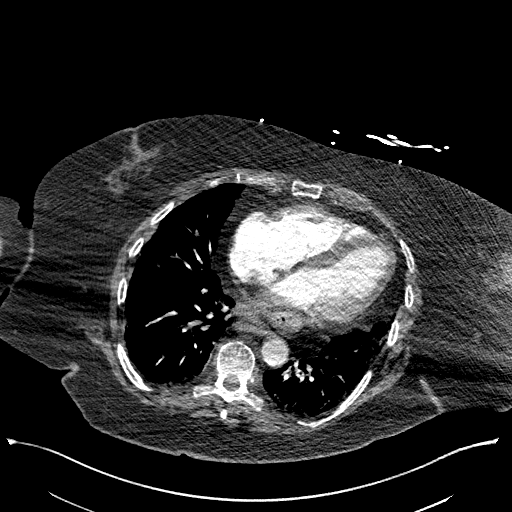
[im 137/314  lung]
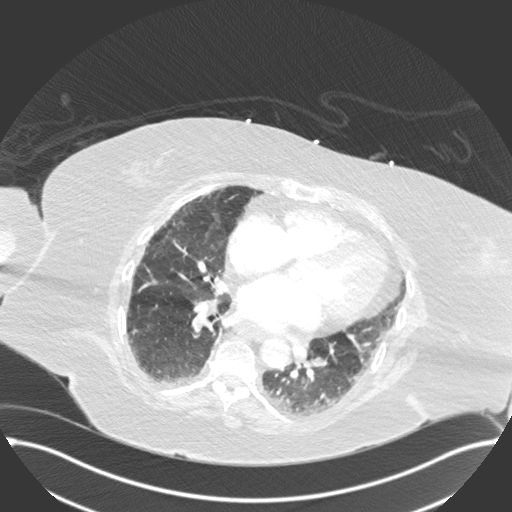
[im 164/314  soft-tissue]
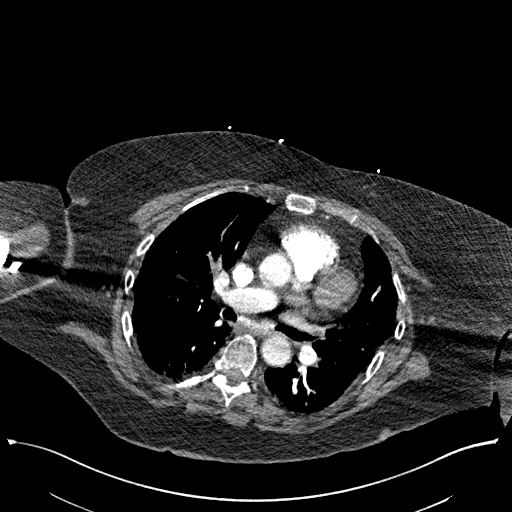
[im 177/314  lung]
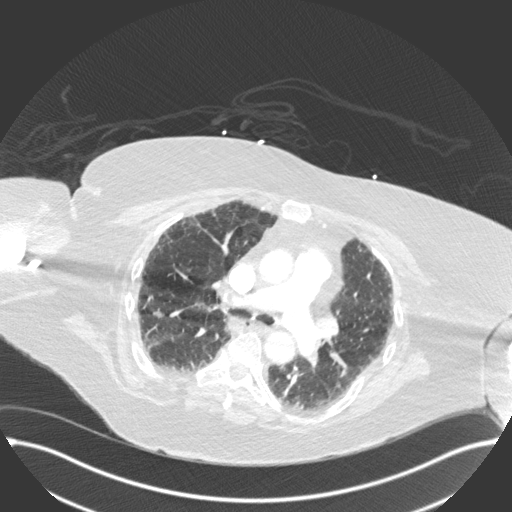
[im 191/314  soft-tissue]
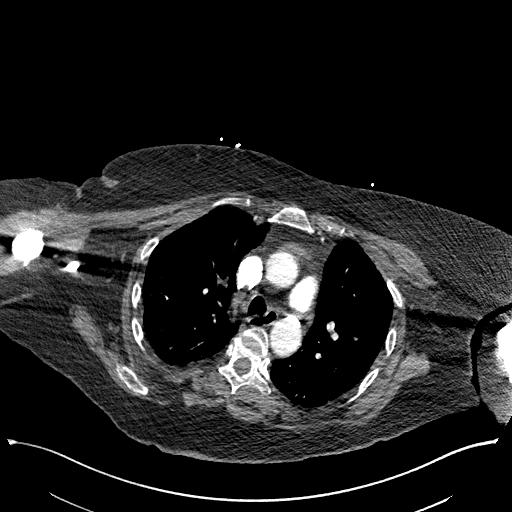
[im 218/314  lung]
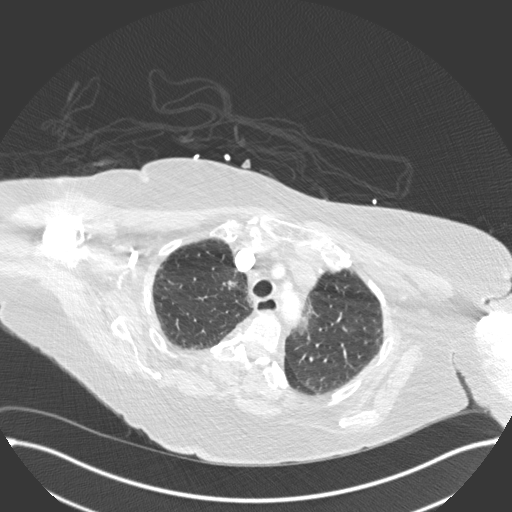
[im 232/314  soft-tissue]
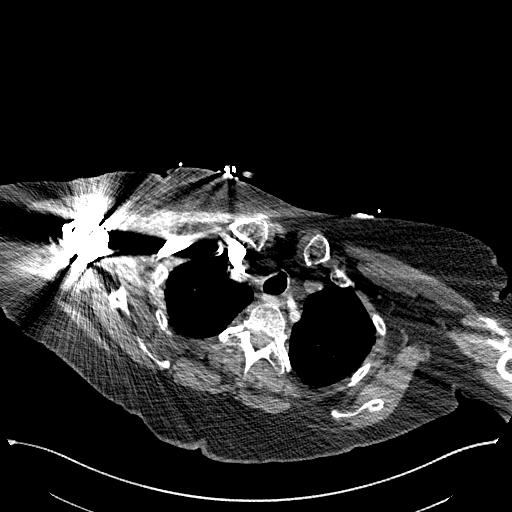
[im 259/314  lung]
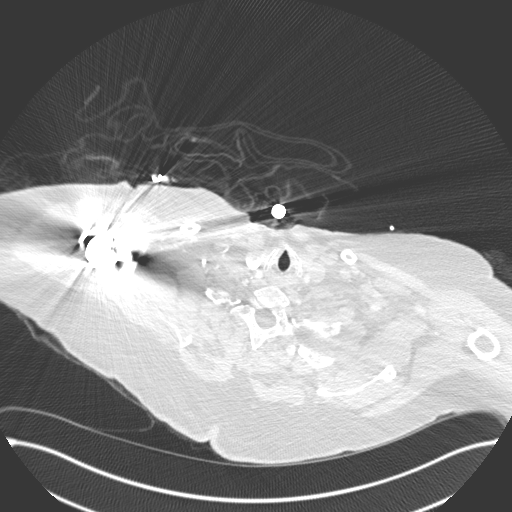
[im 273/314  soft-tissue]
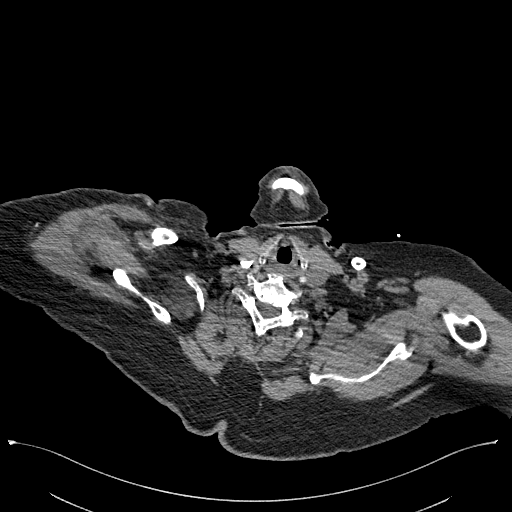
[im 300/314  lung]
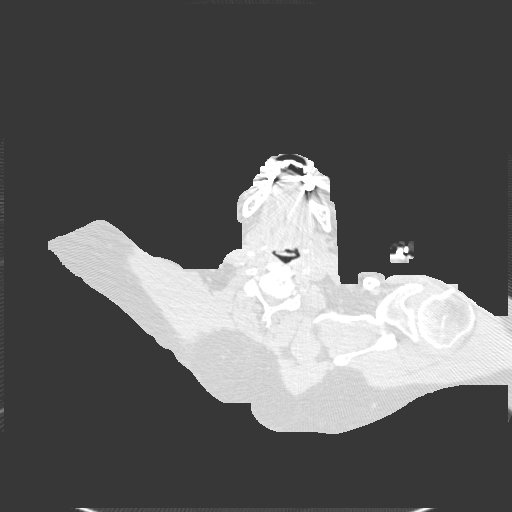

[Series 11: coronal mpr · coronal · 0.65mm/px · 3 of 151 slices shown]
[im 38/151  soft-tissue]
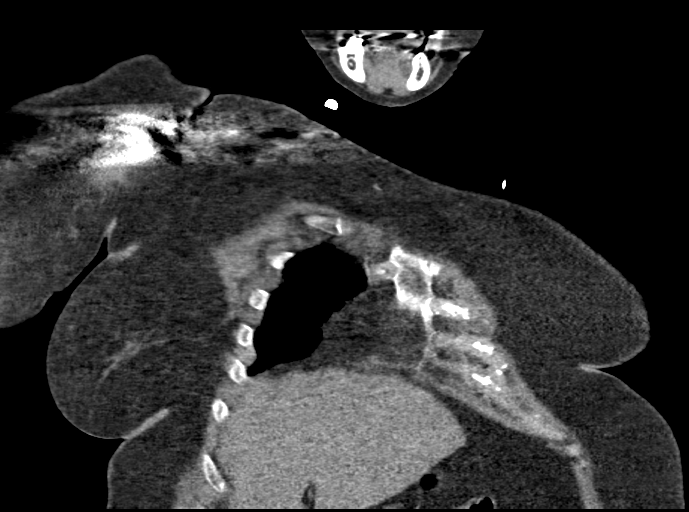
[im 76/151  soft-tissue]
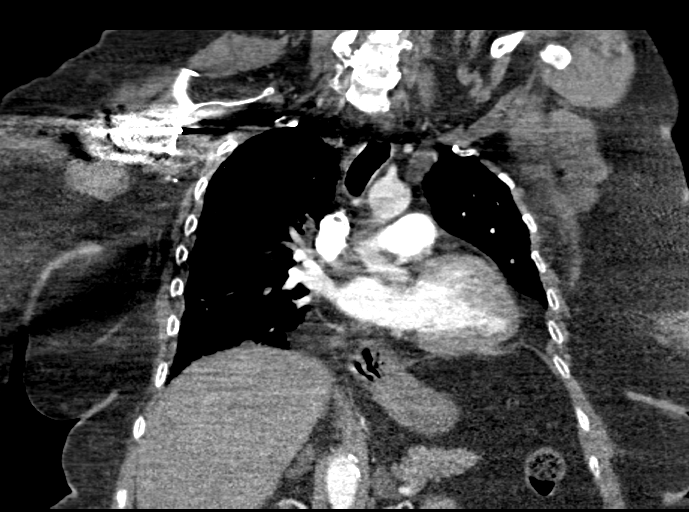
[im 113/151  soft-tissue]
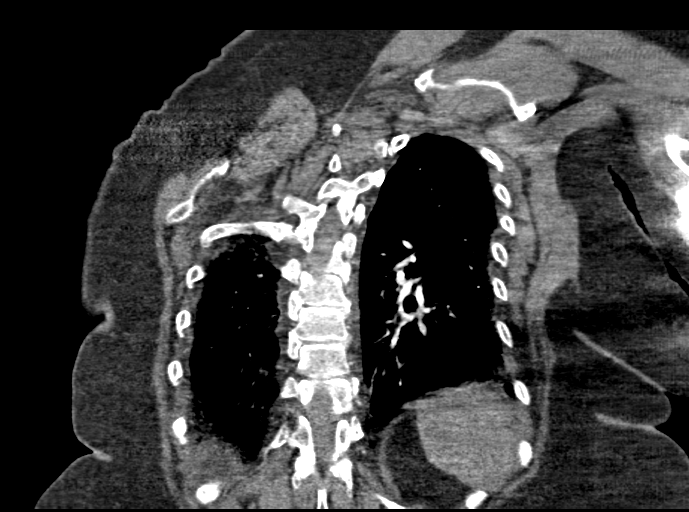

[18 of 46 positions shown; findings below may reference images not displayed]

FINDINGS: Cardiovascular: Pulmonary arterial opacification is good. Study is
positive for pulmonary emboli in the right lower lobe pulmonary
arterial branches. Heart size is normal. Ordinary aortic
atherosclerosis is noted.

Mediastinum/Nodes: No mass or lymphadenopathy. Patient does have a
hiatal hernia.

Lungs/Pleura: Mild emphysema and pulmonary scarring. No infiltrate,
collapse or effusion.

Upper Abdomen: Negative

Musculoskeletal: Thoracic spinal curvature and degenerative changes.

Review of the MIP images confirms the above findings.
IMPRESSION: The study is positive for pulmonary emboli in the right lower lobe
arterial branches. No evidence of right ventricular strain.

Aortic atherosclerosis.

Hiatal hernia.

## 2020-04-28 DIAGNOSIS — Z03818 Encounter for observation for suspected exposure to other biological agents ruled out: Secondary | ICD-10-CM | POA: Diagnosis not present

## 2020-05-08 DIAGNOSIS — Z0000 Encounter for general adult medical examination without abnormal findings: Secondary | ICD-10-CM | POA: Diagnosis not present

## 2020-05-08 DIAGNOSIS — E785 Hyperlipidemia, unspecified: Secondary | ICD-10-CM | POA: Diagnosis not present

## 2020-05-08 DIAGNOSIS — Z1331 Encounter for screening for depression: Secondary | ICD-10-CM | POA: Diagnosis not present

## 2020-05-08 DIAGNOSIS — Z6841 Body Mass Index (BMI) 40.0 and over, adult: Secondary | ICD-10-CM | POA: Diagnosis not present

## 2020-05-10 ENCOUNTER — Encounter: Payer: Self-pay | Admitting: Internal Medicine

## 2020-06-09 ENCOUNTER — Telehealth: Payer: Self-pay | Admitting: *Deleted

## 2020-06-09 NOTE — Telephone Encounter (Signed)
Left message for patient to call and advise if she is still taking Eliquis . Please note on this encounter.

## 2020-06-12 NOTE — Telephone Encounter (Signed)
Only took Eliquis for a few months last year after a surgery. Has not taken anymore  for over 5 months now.

## 2020-06-12 NOTE — Telephone Encounter (Signed)
Noted- removed Eliquis from pt's med list

## 2020-06-13 DIAGNOSIS — Z20822 Contact with and (suspected) exposure to covid-19: Secondary | ICD-10-CM | POA: Diagnosis not present

## 2020-06-21 ENCOUNTER — Other Ambulatory Visit: Payer: Self-pay

## 2020-06-21 ENCOUNTER — Ambulatory Visit (AMBULATORY_SURGERY_CENTER): Payer: Self-pay | Admitting: *Deleted

## 2020-06-21 VITALS — Ht 65.0 in | Wt 247.0 lb

## 2020-06-21 DIAGNOSIS — Z86010 Personal history of colonic polyps: Secondary | ICD-10-CM

## 2020-06-21 MED ORDER — SUTAB 1479-225-188 MG PO TABS: 1.0000 | ORAL_TABLET | ORAL | 0 refills | Status: DC

## 2020-06-21 NOTE — Progress Notes (Signed)
Patient is here in-person for PV. Patient denies any allergies to eggs or soy. Patient denies any problems with anesthesia/sedation. Patient denies any oxygen use at home. Patient denies taking any diet/weight loss medications or blood thinners. Patient is not being treated for MRSA or C-diff. Patient is aware of our care-partner policy and OFHQR-97 safety protocol. COVID-19 vaccines completed on 12/17/2019, per patient.   Prep Prescription coupon given to the patient.

## 2020-06-22 ENCOUNTER — Encounter: Payer: Self-pay | Admitting: Internal Medicine

## 2020-07-05 ENCOUNTER — Ambulatory Visit (AMBULATORY_SURGERY_CENTER): Payer: Medicare HMO | Admitting: Internal Medicine

## 2020-07-05 ENCOUNTER — Other Ambulatory Visit: Payer: Self-pay

## 2020-07-05 ENCOUNTER — Encounter: Payer: Self-pay | Admitting: Internal Medicine

## 2020-07-05 VITALS — BP 127/55 | HR 66 | Temp 97.8°F | Resp 23 | Ht 65.0 in | Wt 247.0 lb

## 2020-07-05 DIAGNOSIS — K635 Polyp of colon: Secondary | ICD-10-CM

## 2020-07-05 DIAGNOSIS — K219 Gastro-esophageal reflux disease without esophagitis: Secondary | ICD-10-CM | POA: Diagnosis not present

## 2020-07-05 DIAGNOSIS — Z86010 Personal history of colonic polyps: Secondary | ICD-10-CM

## 2020-07-05 DIAGNOSIS — D125 Benign neoplasm of sigmoid colon: Secondary | ICD-10-CM

## 2020-07-05 DIAGNOSIS — G2581 Restless legs syndrome: Secondary | ICD-10-CM | POA: Diagnosis not present

## 2020-07-05 DIAGNOSIS — D123 Benign neoplasm of transverse colon: Secondary | ICD-10-CM

## 2020-07-05 MED ORDER — SODIUM CHLORIDE 0.9 % IV SOLN: 500.0000 mL | Freq: Once | INTRAVENOUS | Status: DC

## 2020-07-05 NOTE — Progress Notes (Signed)
Report to PACU, RN, vss, BBS= Clear.  

## 2020-07-05 NOTE — Progress Notes (Signed)
Called to room to assist during endoscopic procedure.  Patient ID and intended procedure confirmed with present staff. Received instructions for my participation in the procedure from the performing physician.  

## 2020-07-05 NOTE — Op Note (Signed)
[facility] Patient Name: Alison York Procedure Date: 07/05/2020 8:56 AM MRN: [id number] Endoscopist: Jerene Bears , MD Age: 79 Referring MD:  Date of Birth: [birth year] Gender: Female Account #: [id number] Procedure:                Colonoscopy Indications:              High risk colon cancer surveillance: Personal                            history of multiple > 10 adenomas and sessile                            serrated polyps (several greater than 10 mm) at                            last colonoscopy: June 2018 Medicines:                Monitored Anesthesia Care Procedure:                Pre-Anesthesia Assessment:                           - Prior to the procedure, a History and Physical                            was performed, and patient medications and                            allergies were reviewed. The patient's tolerance of                            previous anesthesia was also reviewed. The risks                            and benefits of the procedure and the sedation                            options and risks were discussed with the patient.                            All questions were answered, and informed consent                            was obtained. Prior Anticoagulants: The patient has                            taken no previous anticoagulant or antiplatelet                            agents. ASA Grade Assessment: III - A patient with                            severe systemic disease. After reviewing the risks  and benefits, the patient was deemed in                            satisfactory condition to undergo the procedure.                           After obtaining informed consent, the colonoscope                            was passed under direct vision. Throughout the                            procedure, the patient's blood pressure, pulse, and                            oxygen saturations were monitored  continuously. The                            Colonoscope was introduced through the anus and                            advanced to the cecum, identified by                            transillumination. The colonoscopy was performed                            without difficulty. The patient tolerated the                            procedure well. The quality of the bowel                            preparation was good. The ileocecal valve,                            appendiceal orifice, and rectum were photographed. Scope In: 9:10:35 AM Scope Out: 9:26:35 AM Scope Withdrawal Time: 0 hours 12 minutes 49 seconds  Total Procedure Duration: 0 hours 16 minutes 0 seconds  Findings:                 The digital rectal exam was normal.                           Two sessile polyps were found in the transverse                            colon. The polyps were 2 to 3 mm in size. These                            polyps were removed with a cold snare. Resection                            and retrieval were complete.  A 5 mm polyp was found in the sigmoid colon. The                            polyp was sessile. The polyp was removed with a                            cold snare. Resection and retrieval were complete.                           Internal hemorrhoids were found during                            retroflexion. The hemorrhoids were small. Complications:            No immediate complications. Estimated Blood Loss:     Estimated blood loss was minimal. Impression:               - Two 2 to 3 mm polyps in the transverse colon,                            removed with a cold snare. Resected and retrieved.                           - One 5 mm polyp in the sigmoid colon, removed with                            a cold snare. Resected and retrieved.                           - Small internal hemorrhoids. Recommendation:           - Patient has a contact number available for                             emergencies. The signs and symptoms of potential                            delayed complications were discussed with the                            patient. Return to normal activities tomorrow.                            Written discharge instructions were provided to the                            patient.                           - Resume previous diet.                           - Continue present medications.                           -  Await pathology results.                           - No repeat colonoscopy due to age at next                            surveillance interval and lack of high risk polyps                            today. Jerene Bears, MD 07/05/2020 9:32:23 AM This report has been signed electronically.

## 2020-07-05 NOTE — Patient Instructions (Signed)
Thank you for allowing [place] to care for you today!  Final results of polyps will be available in 7-10 days.  No further routine colonoscopies recommended due to current age guidelines and low risk polyps that were removed today.  Resume previous diet and medications today.  Return to your normal activities tomorrow.   YOU HAD AN ENDOSCOPIC PROCEDURE TODAY AT [facility]:   Refer to the procedure report that was given to you for any specific questions about what was found during the examination.  If the procedure report does not answer your questions, please call your gastroenterologist to clarify.  If you requested that your care partner not be given the details of your procedure findings, then the procedure report has been included in a sealed envelope for you to review at your convenience later.  YOU SHOULD EXPECT: Some feelings of bloating in the abdomen. Passage of more gas than usual.  Walking can help get rid of the air that was put into your GI tract during the procedure and reduce the bloating. If you had a lower endoscopy (such as a colonoscopy or flexible sigmoidoscopy) you may notice spotting of blood in your stool or on the toilet paper. If you underwent a bowel prep for your procedure, you may not have a normal bowel movement for a few days.  Please Note:  You might notice some irritation and congestion in your nose or some drainage.  This is from the oxygen used during your procedure.  There is no need for concern and it should clear up in a day or so.  SYMPTOMS TO REPORT IMMEDIATELY:   Following lower endoscopy (colonoscopy or flexible sigmoidoscopy):  Excessive amounts of blood in the stool  Significant tenderness or worsening of abdominal pains  Swelling of the abdomen that is new, acute  Fever of 100F or higher    For urgent or emergent issues, a gastroenterologist can be reached at any hour by calling [phone]. Do not use MyChart messaging for  urgent concerns.    DIET:  We do recommend a small meal at first, but then you may proceed to your regular diet.  Drink plenty of fluids but you should avoid alcoholic beverages for 24 hours.  ACTIVITY:  You should plan to take it easy for the rest of today and you should NOT DRIVE or use heavy machinery until tomorrow (because of the sedation medicines used during the test).    FOLLOW UP: Our staff will call the number listed on your records 48-72 hours following your procedure to check on you and address any questions or concerns that you may have regarding the information given to you following your procedure. If we do not reach you, we will leave a message.  We will attempt to reach you two times.  During this call, we will ask if you have developed any symptoms of COVID 19. If you develop any symptoms (ie: fever, flu-like symptoms, shortness of breath, cough etc.) before then, please call [phone].  If you test positive for Covid 19 in the 2 weeks post procedure, please call and report this information to [place].    If any biopsies were taken you will be contacted by phone or by letter within the next 1-3 weeks.  Please call us at [phone] if you have not heard about the biopsies in 3 weeks.    SIGNATURES/CONFIDENTIALITY: You and/or your care partner have signed paperwork which will be entered into your electronic medical record.  These signatures attest to the fact that that the information above on your After Visit Summary has been reviewed and is understood.  Full responsibility of the confidentiality of this discharge information lies with you and/or your care-partner.

## 2020-07-05 NOTE — Progress Notes (Signed)
VS by CW  No changes to medical or social hx since previsit.  

## 2020-07-07 ENCOUNTER — Telehealth: Payer: Self-pay | Admitting: *Deleted

## 2020-07-07 ENCOUNTER — Telehealth: Payer: Self-pay

## 2020-07-07 NOTE — Telephone Encounter (Signed)
No answer for post procedure call back. Left message for patient to call back with questions or concerns.

## 2020-07-07 NOTE — Telephone Encounter (Signed)
Left message on follow up call. 

## 2020-07-13 ENCOUNTER — Encounter: Payer: Self-pay | Admitting: Internal Medicine

## 2020-07-26 DIAGNOSIS — Z0100 Encounter for examination of eyes and vision without abnormal findings: Secondary | ICD-10-CM | POA: Diagnosis not present

## 2020-07-26 DIAGNOSIS — H524 Presbyopia: Secondary | ICD-10-CM | POA: Diagnosis not present

## 2020-08-30 DIAGNOSIS — Z23 Encounter for immunization: Secondary | ICD-10-CM | POA: Diagnosis not present

## 2020-08-30 DIAGNOSIS — Z6841 Body Mass Index (BMI) 40.0 and over, adult: Secondary | ICD-10-CM | POA: Diagnosis not present

## 2020-08-30 DIAGNOSIS — R0600 Dyspnea, unspecified: Secondary | ICD-10-CM | POA: Diagnosis not present

## 2020-08-30 DIAGNOSIS — G2581 Restless legs syndrome: Secondary | ICD-10-CM | POA: Diagnosis not present

## 2020-08-30 DIAGNOSIS — F419 Anxiety disorder, unspecified: Secondary | ICD-10-CM | POA: Diagnosis not present

## 2020-09-04 DIAGNOSIS — R195 Other fecal abnormalities: Secondary | ICD-10-CM | POA: Diagnosis not present

## 2020-09-15 DIAGNOSIS — Z20822 Contact with and (suspected) exposure to covid-19: Secondary | ICD-10-CM | POA: Diagnosis not present

## 2020-09-15 DIAGNOSIS — J209 Acute bronchitis, unspecified: Secondary | ICD-10-CM | POA: Diagnosis not present

## 2020-09-29 ENCOUNTER — Encounter: Payer: Self-pay | Admitting: Gastroenterology

## 2020-09-29 DIAGNOSIS — Z6841 Body Mass Index (BMI) 40.0 and over, adult: Secondary | ICD-10-CM | POA: Diagnosis not present

## 2020-09-29 DIAGNOSIS — E7800 Pure hypercholesterolemia, unspecified: Secondary | ICD-10-CM | POA: Diagnosis not present

## 2020-09-29 DIAGNOSIS — K219 Gastro-esophageal reflux disease without esophagitis: Secondary | ICD-10-CM | POA: Diagnosis not present

## 2020-09-29 DIAGNOSIS — R7303 Prediabetes: Secondary | ICD-10-CM | POA: Diagnosis not present

## 2020-09-29 DIAGNOSIS — D509 Iron deficiency anemia, unspecified: Secondary | ICD-10-CM | POA: Diagnosis not present

## 2020-09-29 DIAGNOSIS — Z139 Encounter for screening, unspecified: Secondary | ICD-10-CM | POA: Diagnosis not present

## 2020-09-29 DIAGNOSIS — F419 Anxiety disorder, unspecified: Secondary | ICD-10-CM | POA: Diagnosis not present

## 2020-09-29 DIAGNOSIS — M810 Age-related osteoporosis without current pathological fracture: Secondary | ICD-10-CM | POA: Diagnosis not present

## 2020-09-29 DIAGNOSIS — N183 Chronic kidney disease, stage 3 unspecified: Secondary | ICD-10-CM | POA: Diagnosis not present

## 2020-09-29 DIAGNOSIS — G2581 Restless legs syndrome: Secondary | ICD-10-CM | POA: Diagnosis not present

## 2020-10-05 ENCOUNTER — Encounter: Payer: Self-pay | Admitting: Gastroenterology

## 2020-10-05 ENCOUNTER — Ambulatory Visit: Payer: Medicare HMO | Admitting: Gastroenterology

## 2020-10-05 VITALS — BP 128/62 | HR 88 | Ht 64.0 in | Wt 243.0 lb

## 2020-10-05 DIAGNOSIS — R053 Chronic cough: Secondary | ICD-10-CM

## 2020-10-05 DIAGNOSIS — R195 Other fecal abnormalities: Secondary | ICD-10-CM | POA: Insufficient documentation

## 2020-10-05 DIAGNOSIS — D509 Iron deficiency anemia, unspecified: Secondary | ICD-10-CM | POA: Diagnosis not present

## 2020-10-05 MED ORDER — OMEPRAZOLE 40 MG PO CPDR: 40.0000 mg | DELAYED_RELEASE_CAPSULE | Freq: Two times a day (BID) | ORAL | 3 refills | Status: AC

## 2020-10-05 NOTE — Progress Notes (Addendum)
10/05/2020 Alison York [id number] [birth year]   HISTORY OF PRESENT ILLNESS: This is a pleasant 80 year old female who is a patient of Dr. Vena Rua.  She just had a colonoscopy by him in November 2021.  She is here today at the request of her PCP in order to discuss EGD for evaluation of iron deficiency anemia and heme positive stools.  She tells me that she was initially on iron supplements twice daily and is now only taking them once daily because they give her diarrhea.  She tells me that her last lab/blood counts were "good"/better.  We do not have any Hemoccults or lab results from her PCP.  She reports feeling fatigued all the time.  She denies seeing any blood in her stools or black stools, they are dark because of the iron.  She does have a history of a large hiatal hernia on her last EGD in June 2018.  She is on omeprazole 40 mg daily.  She does feel some acid reflux type symptoms and feels a lot of times her food gets down to her epigastrium and just sits there and she will occasionally have episodes of vomiting where she will vomit her food back up.  She also complains of an ongoing cough for several months.   Past Medical History:  Diagnosis Date  . Anxiety state, unspecified   . Cerebral hemorrhage ([facility]) 1995  . Dysphagia   . Erosive esophagitis   . Esophageal reflux   . Esophageal stricture   . Hiatal hernia   . Hypercholesterolemia   . Hypertension   . Internal hemorrhoids   . Osteoporosis   . Personal history of colonic polyps 1999 & 11/04/2011   villous adenoma & tubular adenoma  . Prediabetes   . Pulmonary embolism ([place]) 2020  . Restless legs syndrome (RLS)   . Shortness of breath   . Vitamin D deficiency    Past Surgical History:  Procedure Laterality Date  . BREAST BIOPSY     bilateral  . CHONDROPLASTY  11/05/2010  . COLONOSCOPY  2018  . KNEE ARTHROSCOPY W/ MENISCECTOMY  11/05/2010  . PARTIAL HYSTERECTOMY  age 96-35  . POLYPECTOMY    . SHOULDER SURGERY Right  2020  . TONSILLECTOMY      reports that she quit smoking about 48 years ago. She has never used smokeless tobacco. She reports that she does not drink alcohol and does not use drugs. family history includes Alzheimer's disease in her sister; Breast cancer in her daughter and sister; CAD in her mother; Diabetes in her sister; Heart failure in her sister; Hypertension in her father; Kidney cancer in her brother; Mental illness in her sister; Stroke in her father; Tuberculosis in her mother. Allergies  Allergen Reactions  . Oxycodone Shortness Of Breath  . Simvastatin Other (See Comments)    Leg pain      Outpatient Encounter Medications as of 10/05/2020  Medication Sig  . acetaminophen (TYLENOL) 650 MG CR tablet Take 650 mg by mouth every 8 (eight) hours as needed for pain.  Marland Kitchen albuterol (VENTOLIN HFA) 108 (90 Base) MCG/ACT inhaler Inhale 2 puffs into the lungs every 4 (four) hours as needed for wheezing or shortness of breath.  . ALPRAZolam (XANAX) 0.5 MG tablet Take 0.5 mg by mouth every 8 (eight) hours as needed for anxiety.   . Calcium-Vitamin D 600-125 MG-UNIT TABS Take 1 tablet by mouth 2 (two) times a week.   . ferrous sulfate 325 (65 FE) MG  tablet Take 325 mg by mouth daily with breakfast.  . Multiple Vitamin (MULTIVITAMIN) capsule Take 1 capsule by mouth 2 (two) times a week.   Marland Kitchen omeprazole (PRILOSEC) 40 MG capsule Take 40 mg by mouth daily at 3 pm.   . PARoxetine (PAXIL) 20 MG tablet Take 20 mg by mouth daily at 3 pm.   . pramipexole (MIRAPEX) 0.25 MG tablet   . pravastatin (PRAVACHOL) 80 MG tablet Take 1 tablet (80 mg total) by mouth every evening.  . [DISCONTINUED] benzonatate (TESSALON) 200 MG capsule Take 200 mg by mouth 3 (three) times daily as needed for cough. (Patient not taking: Reported on 06/21/2020)  . [DISCONTINUED] Cholecalciferol (VITAMIN D3 PO) Take by mouth.  . [DISCONTINUED] Cyanocobalamin (VITAMIN B-12 PO) Take by mouth.  . [DISCONTINUED] naproxen (NAPROSYN) 500  MG tablet Take 500 mg by mouth at bedtime.    No facility-administered encounter medications on file as of 10/05/2020.     REVIEW OF SYSTEMS  : All other systems reviewed and negative except where noted in the History of Present Illness.   PHYSICAL EXAM: BP 128/62   Pulse 88   Ht 5\' 4"  (1.626 m)   Wt 243 lb (110.2 kg)   BMI 41.71 kg/m  General: Well developed white female in no acute distress Head: Normocephalic and atraumatic Eyes:  Sclerae anicteric, conjunctiva pink. Ears: Normal auditory acuity Lungs:  Slight wheezing noted at both lung bases posteriorly. Heart: Regular rate and rhythm; no W/R/G. Abdomen: Soft, non-distended.  BS present.  Mild epigastric TTP. Musculoskeletal: Symmetrical with no gross deformities  Skin: No lesions on visible extremities Extremities: No edema  Neurological: Alert oriented x 4, grossly non-focal Psychological:  Alert and cooperative. Normal mood and affect  ASSESSMENT AND PLAN: *Iron deficiency anemia and heme positive stools: This is per patient report.  We do not have any lab results or records.  She is on ferrous sulfate once a day and was initially on twice daily dosing.  She tells me that her last labs were "good"/better.  Her PCP was recommending an EGD.  She does have history of a large hiatal hernia so it is possible that she could have Cameron's erosions that are causing this.  She just had a colonoscopy in November.  We will plan for EGD with Dr. Hilarie Fredrickson.  We will request lab results and stool study records from her PCP.  The risks, benefits, and alternatives to EGD were discussed with the patient and she consents to proceed.  *Chronic cough: This has been present for several months.  Does have some reflux type symptoms.  We will increase her omeprazole to 40 mg twice daily for the next 4 to 6 weeks and see if  this helps if it is acid reflux related.  New prescription sent to pharmacy.  **Addendum: Hemoglobin on August 30, 2020 was 8.5 g.   Repeat hemoglobin on September 29, 2020 was 11.1 g.  Ferritin was 38.   CC:  Cyndi Bender, PA-C

## 2020-10-05 NOTE — Patient Instructions (Signed)
If you are age 80 or older, your body mass index should be between 23-30. Your Body mass index is 41.71 kg/m. If this is out of the aforementioned range listed, please consider follow up with your Primary Care Provider.  If you are age 39 or younger, your body mass index should be between 19-25. Your Body mass index is 41.71 kg/m. If this is out of the aformentioned range listed, please consider follow up with your Primary Care Provider.   We have sent the following medications to your pharmacy for you to pick up at your convenience: Omeprazole 40 mg twice daily 30-60 minutes before breakfast and dinner.   You have been scheduled for an endoscopy. Please follow written instructions given to you at your visit today. If you use inhalers (even only as needed), please bring them with you on the day of your procedure.  Due to recent changes in healthcare laws, you may see the results of your imaging and laboratory studies on MyChart before your provider has had a chance to review them.  We understand that in some cases there may be results that are confusing or concerning to you. Not all laboratory results come back in the same time frame and the provider may be waiting for multiple results in order to interpret others.  Please give [place] 48 hours in order for your provider to thoroughly review all the results before contacting the office for clarification of your results.   Thank you for choosing me and [facility].  Alonza Bogus, PA-C

## 2020-10-06 DIAGNOSIS — R053 Chronic cough: Secondary | ICD-10-CM | POA: Diagnosis not present

## 2020-10-06 DIAGNOSIS — Z6841 Body Mass Index (BMI) 40.0 and over, adult: Secondary | ICD-10-CM | POA: Diagnosis not present

## 2020-10-14 NOTE — Progress Notes (Signed)
Addendum: Reviewed and agree with assessment and management plan for EGD Audrianna Driskill, Lajuan Lines, MD

## 2020-10-18 ENCOUNTER — Encounter: Payer: Self-pay | Admitting: Internal Medicine

## 2020-10-25 ENCOUNTER — Ambulatory Visit (AMBULATORY_SURGERY_CENTER): Payer: Medicare HMO | Admitting: Internal Medicine

## 2020-10-25 ENCOUNTER — Other Ambulatory Visit: Payer: Self-pay

## 2020-10-25 ENCOUNTER — Encounter: Payer: Self-pay | Admitting: Internal Medicine

## 2020-10-25 VITALS — BP 117/55 | HR 66 | Temp 97.9°F | Resp 20 | Ht 64.0 in | Wt 243.0 lb

## 2020-10-25 DIAGNOSIS — R195 Other fecal abnormalities: Secondary | ICD-10-CM | POA: Diagnosis not present

## 2020-10-25 DIAGNOSIS — K219 Gastro-esophageal reflux disease without esophagitis: Secondary | ICD-10-CM | POA: Diagnosis not present

## 2020-10-25 DIAGNOSIS — R1310 Dysphagia, unspecified: Secondary | ICD-10-CM

## 2020-10-25 DIAGNOSIS — D509 Iron deficiency anemia, unspecified: Secondary | ICD-10-CM

## 2020-10-25 DIAGNOSIS — K449 Diaphragmatic hernia without obstruction or gangrene: Secondary | ICD-10-CM

## 2020-10-25 MED ORDER — SODIUM CHLORIDE 0.9 % IV SOLN: 500.0000 mL | Freq: Once | INTRAVENOUS | Status: DC

## 2020-10-25 NOTE — Op Note (Signed)
[facility] Patient Name: Alison York Procedure Date: 10/25/2020 9:32 AM MRN: [id number] Endoscopist: Jerene Bears , MD Age: 80 Referring MD:  Date of Birth: Oct 17, 1940 Gender: Female Account #: [id number] Procedure:                Upper GI endoscopy Indications:              Iron deficiency anemia, heme positive stools,                            question of dysphagia symptom Medicines:                Monitored Anesthesia Care Procedure:                Pre-Anesthesia Assessment:                           - Prior to the procedure, a History and Physical                            was performed, and patient medications and                            allergies were reviewed. The patient's tolerance of                            previous anesthesia was also reviewed. The risks                            and benefits of the procedure and the sedation                            options and risks were discussed with the patient.                            All questions were answered, and informed consent                            was obtained. Prior Anticoagulants: The patient has                            taken no previous anticoagulant or antiplatelet                            agents. ASA Grade Assessment: III - A patient with                            severe systemic disease. After reviewing the risks                            and benefits, the patient was deemed in                            satisfactory condition to undergo the procedure.  After obtaining informed consent, the endoscope was                            passed under direct vision. Throughout the                            procedure, the patient's blood pressure, pulse, and                            oxygen saturations were monitored continuously. The                            Endoscope was introduced through the mouth, and                            advanced to the second part of  duodenum. The upper                            GI endoscopy was accomplished without difficulty.                            The patient tolerated the procedure well. Scope In: Scope Out: Findings:                 Normal mucosa was found in the entire esophagus.                           A 9 cm hiatal hernia was found. The hernia appears                            complex and query a paraesophageal component in                            addition to sliding-type. The proximal extent of                            the gastric folds (end of tubular esophagus) was 31                            cm from the incisors. The hiatal narrowing was 40                            cm from the incisors. The Z-line was 31 cm from the                            incisors. The esophagus if foreshortened due to the                            large hiatal hernia. No esophageal strictures are                            seen.  Normal mucosa was found in the entire examined                            stomach. No Cameron's lesions/ulcers seen today,                            but with large hiatal hernia there remains a risk                            for these lesions.                           The examined duodenum was normal. Complications:            No immediate complications. Estimated Blood Loss:     Estimated blood loss: none. Impression:               - Normal mucosa was found in the entire esophagus.                           - 9 cm hiatal hernia.                           - Normal mucosa was found in the entire stomach.                           - Normal examined duodenum.                           - No specimens collected. Recommendation:           - Patient has a contact number available for                            emergencies. The signs and symptoms of potential                            delayed complications were discussed with the                            patient.  Return to normal activities tomorrow.                            Written discharge instructions were provided to the                            patient.                           - Resume previous diet.                           - Continue present medications.                           - Continue daily oral iron and omeprazole to help  reduce acid given large hiatal hernia and GERD                            history.                           - Close attention to Hgb and iron studies. If iron                            fails to normalize with oral replacement then IV                            iron is recommended.                           - VCE can be considered if refractory IDA.                           - If hiatal hernia is symptomatic (upper abdominal                            pain, nausea, vomiting) then surgical consultation                            recommended (would likely need barium esophagram                            and UGI series as well).                           - Follow-up with me in 3 months. Jerene Bears, MD 10/25/2020 9:54:53 AM This report has been signed electronically.

## 2020-10-25 NOTE — Progress Notes (Signed)
VS by CW. ?

## 2020-10-25 NOTE — Progress Notes (Signed)
To PACU, VSS. Report to [email or web address] 

## 2020-10-25 NOTE — Patient Instructions (Signed)
YOU HAD AN ENDOSCOPIC PROCEDURE TODAY AT THE [facility]:   Refer to the procedure report that was given to you for any specific questions about what was found during the examination.  If the procedure report does not answer your questions, please call your gastroenterologist to clarify.  If you requested that your care partner not be given the details of your procedure findings, then the procedure report has been included in a sealed envelope for you to review at your convenience later.  YOU SHOULD EXPECT: Some feelings of bloating in the abdomen. Passage of more gas than usual.  Walking can help get rid of the air that was put into your GI tract during the procedure and reduce the bloating. If you had a lower endoscopy (such as a colonoscopy or flexible sigmoidoscopy) you may notice spotting of blood in your stool or on the toilet paper. If you underwent a bowel prep for your procedure, you may not have a normal bowel movement for a few days.  Please Note:  You might notice some irritation and congestion in your nose or some drainage.  This is from the oxygen used during your procedure.  There is no need for concern and it should clear up in a day or so.  SYMPTOMS TO REPORT IMMEDIATELY:   Following lower endoscopy (colonoscopy or flexible sigmoidoscopy):  Excessive amounts of blood in the stool  Significant tenderness or worsening of abdominal pains  Swelling of the abdomen that is new, acute  Fever of 100F or higher   Following upper endoscopy (EGD)  Vomiting of blood or coffee ground material  New chest pain or pain under the shoulder blades  Painful or persistently difficult swallowing  New shortness of breath  Fever of 100F or higher  Black, tarry-looking stools  For urgent or emergent issues, a gastroenterologist can be reached at any hour by calling [phone]. Do not use MyChart messaging for urgent concerns.    DIET:  We do recommend a small meal at first, but  then you may proceed to your regular diet.  Drink plenty of fluids but you should avoid alcoholic beverages for 24 hours.  ACTIVITY:  You should plan to take it easy for the rest of today and you should NOT DRIVE or use heavy machinery until tomorrow (because of the sedation medicines used during the test).    FOLLOW UP: Our staff will call the number listed on your records 48-72 hours following your procedure to check on you and address any questions or concerns that you may have regarding the information given to you following your procedure. If we do not reach you, we will leave a message.  We will attempt to reach you two times.  During this call, we will ask if you have developed any symptoms of COVID 19. If you develop any symptoms (ie: fever, flu-like symptoms, shortness of breath, cough etc.) before then, please call [phone].  If you test positive for Covid 19 in the 2 weeks post procedure, please call and report this information to us.    If any biopsies were taken you will be contacted by phone or by letter within the next 1-3 weeks.  Please call us at [phone] if you have not heard about the biopsies in 3 weeks.    SIGNATURES/CONFIDENTIALITY: You and/or your care partner have signed paperwork which will be entered into your electronic medical record.  These signatures attest to the fact that that the information above on   your After Visit Summary has been reviewed and is understood.  Full responsibility of the confidentiality of this discharge information lies with you and/or your care-partner. 

## 2020-10-27 ENCOUNTER — Telehealth: Payer: Self-pay | Admitting: *Deleted

## 2020-10-27 NOTE — Telephone Encounter (Signed)
  Follow up Call-  Call back number 10/25/2020 07/05/2020  Post procedure Call Back phone  # [phone]  Permission to leave phone message Yes Yes  Some recent data might be hidden     Patient questions:  Message left to call us if necessary.   Second call.

## 2020-10-27 NOTE — Telephone Encounter (Signed)
  Follow up Call-  Call back number 10/25/2020 07/05/2020  Post procedure Call Back phone  # [phone]  Permission to leave phone message Yes Yes  Some recent data might be hidden     Patient questions:  Message left to call us if necessary.

## 2020-10-30 ENCOUNTER — Encounter: Payer: Self-pay | Admitting: Pulmonary Disease

## 2020-11-05 DIAGNOSIS — I361 Nonrheumatic tricuspid (valve) insufficiency: Secondary | ICD-10-CM

## 2020-11-05 DIAGNOSIS — K219 Gastro-esophageal reflux disease without esophagitis: Secondary | ICD-10-CM | POA: Diagnosis not present

## 2020-11-05 DIAGNOSIS — J4540 Moderate persistent asthma, uncomplicated: Secondary | ICD-10-CM | POA: Diagnosis not present

## 2020-11-05 DIAGNOSIS — K449 Diaphragmatic hernia without obstruction or gangrene: Secondary | ICD-10-CM | POA: Diagnosis not present

## 2020-11-05 DIAGNOSIS — I272 Pulmonary hypertension, unspecified: Secondary | ICD-10-CM

## 2020-11-05 DIAGNOSIS — Z79891 Long term (current) use of opiate analgesic: Secondary | ICD-10-CM | POA: Diagnosis not present

## 2020-11-05 DIAGNOSIS — J441 Chronic obstructive pulmonary disease with (acute) exacerbation: Secondary | ICD-10-CM | POA: Diagnosis not present

## 2020-11-05 DIAGNOSIS — F419 Anxiety disorder, unspecified: Secondary | ICD-10-CM | POA: Diagnosis not present

## 2020-11-05 DIAGNOSIS — R0602 Shortness of breath: Secondary | ICD-10-CM | POA: Diagnosis not present

## 2020-11-05 DIAGNOSIS — J8410 Pulmonary fibrosis, unspecified: Secondary | ICD-10-CM | POA: Diagnosis not present

## 2020-11-05 DIAGNOSIS — I350 Nonrheumatic aortic (valve) stenosis: Secondary | ICD-10-CM

## 2020-11-05 DIAGNOSIS — R0902 Hypoxemia: Secondary | ICD-10-CM | POA: Diagnosis not present

## 2020-11-05 DIAGNOSIS — E785 Hyperlipidemia, unspecified: Secondary | ICD-10-CM | POA: Diagnosis not present

## 2020-11-05 DIAGNOSIS — R0600 Dyspnea, unspecified: Secondary | ICD-10-CM | POA: Diagnosis not present

## 2020-11-05 DIAGNOSIS — Z79899 Other long term (current) drug therapy: Secondary | ICD-10-CM | POA: Diagnosis not present

## 2020-11-05 DIAGNOSIS — I2699 Other pulmonary embolism without acute cor pulmonale: Secondary | ICD-10-CM | POA: Diagnosis not present

## 2020-11-05 DIAGNOSIS — R059 Cough, unspecified: Secondary | ICD-10-CM | POA: Diagnosis not present

## 2020-11-05 DIAGNOSIS — R911 Solitary pulmonary nodule: Secondary | ICD-10-CM | POA: Diagnosis not present

## 2020-11-05 DIAGNOSIS — Z9981 Dependence on supplemental oxygen: Secondary | ICD-10-CM | POA: Diagnosis not present

## 2020-11-07 ENCOUNTER — Other Ambulatory Visit: Payer: Self-pay

## 2020-11-07 NOTE — Patient Outreach (Signed)
[facility]) Care Management  11/07/2020  Alison York 07-Nov-1940 [id number]     Transition of Care Referral  Referral Date: 11/07/2020 Referral Source: [facility] Discharge Report Date of Discharge: 11/07/2020 Facility: [facility]    Referral received. PCP office listed as doing TOC calls.     Plan: RN CM will close case.   Enzo Montgomery, RN,BSN,CCM [facility] Telephonic Care Management Coordinator Direct Phone: [phone] Toll Free: [phone] Fax: [phone]

## 2020-11-14 ENCOUNTER — Institutional Professional Consult (permissible substitution): Payer: Medicare HMO | Admitting: Pulmonary Disease

## 2020-11-16 ENCOUNTER — Institutional Professional Consult (permissible substitution): Payer: Medicare HMO | Admitting: Pulmonary Disease

## 2020-11-22 DIAGNOSIS — J8410 Pulmonary fibrosis, unspecified: Secondary | ICD-10-CM | POA: Diagnosis not present

## 2020-11-22 DIAGNOSIS — J441 Chronic obstructive pulmonary disease with (acute) exacerbation: Secondary | ICD-10-CM | POA: Diagnosis not present

## 2020-11-22 DIAGNOSIS — Z79899 Other long term (current) drug therapy: Secondary | ICD-10-CM | POA: Diagnosis not present

## 2020-11-22 DIAGNOSIS — Z6841 Body Mass Index (BMI) 40.0 and over, adult: Secondary | ICD-10-CM | POA: Diagnosis not present

## 2020-12-12 DIAGNOSIS — G2581 Restless legs syndrome: Secondary | ICD-10-CM | POA: Diagnosis not present

## 2020-12-12 DIAGNOSIS — J301 Allergic rhinitis due to pollen: Secondary | ICD-10-CM | POA: Diagnosis not present

## 2020-12-12 DIAGNOSIS — J8410 Pulmonary fibrosis, unspecified: Secondary | ICD-10-CM | POA: Diagnosis not present

## 2020-12-12 DIAGNOSIS — G4733 Obstructive sleep apnea (adult) (pediatric): Secondary | ICD-10-CM | POA: Diagnosis not present

## 2020-12-12 DIAGNOSIS — J4520 Mild intermittent asthma, uncomplicated: Secondary | ICD-10-CM | POA: Diagnosis not present

## 2020-12-12 DIAGNOSIS — R5383 Other fatigue: Secondary | ICD-10-CM | POA: Diagnosis not present

## 2020-12-14 DIAGNOSIS — J8410 Pulmonary fibrosis, unspecified: Secondary | ICD-10-CM | POA: Diagnosis not present

## 2020-12-27 DIAGNOSIS — Z79899 Other long term (current) drug therapy: Secondary | ICD-10-CM | POA: Diagnosis not present

## 2020-12-27 DIAGNOSIS — N898 Other specified noninflammatory disorders of vagina: Secondary | ICD-10-CM | POA: Diagnosis not present

## 2020-12-27 DIAGNOSIS — E7800 Pure hypercholesterolemia, unspecified: Secondary | ICD-10-CM | POA: Diagnosis not present

## 2020-12-27 DIAGNOSIS — J8410 Pulmonary fibrosis, unspecified: Secondary | ICD-10-CM | POA: Diagnosis not present

## 2020-12-27 DIAGNOSIS — R7303 Prediabetes: Secondary | ICD-10-CM | POA: Diagnosis not present

## 2020-12-27 DIAGNOSIS — D509 Iron deficiency anemia, unspecified: Secondary | ICD-10-CM | POA: Diagnosis not present

## 2021-01-02 DIAGNOSIS — G2581 Restless legs syndrome: Secondary | ICD-10-CM | POA: Diagnosis not present

## 2021-01-02 DIAGNOSIS — R5383 Other fatigue: Secondary | ICD-10-CM | POA: Diagnosis not present

## 2021-01-02 DIAGNOSIS — J301 Allergic rhinitis due to pollen: Secondary | ICD-10-CM | POA: Diagnosis not present

## 2021-01-02 DIAGNOSIS — G4733 Obstructive sleep apnea (adult) (pediatric): Secondary | ICD-10-CM | POA: Diagnosis not present

## 2021-01-02 DIAGNOSIS — J8410 Pulmonary fibrosis, unspecified: Secondary | ICD-10-CM | POA: Diagnosis not present

## 2021-01-02 DIAGNOSIS — J4520 Mild intermittent asthma, uncomplicated: Secondary | ICD-10-CM | POA: Diagnosis not present

## 2021-01-02 LAB — PULMONARY FUNCTION TEST

## 2021-01-25 DIAGNOSIS — J301 Allergic rhinitis due to pollen: Secondary | ICD-10-CM | POA: Diagnosis not present

## 2021-01-25 DIAGNOSIS — Z03818 Encounter for observation for suspected exposure to other biological agents ruled out: Secondary | ICD-10-CM | POA: Diagnosis not present

## 2021-01-25 DIAGNOSIS — G4733 Obstructive sleep apnea (adult) (pediatric): Secondary | ICD-10-CM | POA: Diagnosis not present

## 2021-01-25 DIAGNOSIS — J4520 Mild intermittent asthma, uncomplicated: Secondary | ICD-10-CM | POA: Diagnosis not present

## 2021-01-25 DIAGNOSIS — G2581 Restless legs syndrome: Secondary | ICD-10-CM | POA: Diagnosis not present

## 2021-01-25 DIAGNOSIS — R5383 Other fatigue: Secondary | ICD-10-CM | POA: Diagnosis not present

## 2021-01-25 DIAGNOSIS — J8410 Pulmonary fibrosis, unspecified: Secondary | ICD-10-CM | POA: Diagnosis not present

## 2021-01-25 DIAGNOSIS — J069 Acute upper respiratory infection, unspecified: Secondary | ICD-10-CM | POA: Diagnosis not present

## 2021-02-16 DIAGNOSIS — M1711 Unilateral primary osteoarthritis, right knee: Secondary | ICD-10-CM | POA: Diagnosis not present

## 2021-03-30 DIAGNOSIS — K219 Gastro-esophageal reflux disease without esophagitis: Secondary | ICD-10-CM | POA: Diagnosis not present

## 2021-03-30 DIAGNOSIS — G2581 Restless legs syndrome: Secondary | ICD-10-CM | POA: Diagnosis not present

## 2021-03-30 DIAGNOSIS — F419 Anxiety disorder, unspecified: Secondary | ICD-10-CM | POA: Diagnosis not present

## 2021-03-30 DIAGNOSIS — M810 Age-related osteoporosis without current pathological fracture: Secondary | ICD-10-CM | POA: Diagnosis not present

## 2021-03-30 DIAGNOSIS — Z79899 Other long term (current) drug therapy: Secondary | ICD-10-CM | POA: Diagnosis not present

## 2021-03-30 DIAGNOSIS — Z6841 Body Mass Index (BMI) 40.0 and over, adult: Secondary | ICD-10-CM | POA: Diagnosis not present

## 2021-03-30 DIAGNOSIS — R7303 Prediabetes: Secondary | ICD-10-CM | POA: Diagnosis not present

## 2021-03-30 DIAGNOSIS — N183 Chronic kidney disease, stage 3 unspecified: Secondary | ICD-10-CM | POA: Diagnosis not present

## 2021-03-30 DIAGNOSIS — E7800 Pure hypercholesterolemia, unspecified: Secondary | ICD-10-CM | POA: Diagnosis not present

## 2021-03-30 DIAGNOSIS — D509 Iron deficiency anemia, unspecified: Secondary | ICD-10-CM | POA: Diagnosis not present

## 2021-05-10 DIAGNOSIS — E669 Obesity, unspecified: Secondary | ICD-10-CM | POA: Diagnosis not present

## 2021-05-10 DIAGNOSIS — E785 Hyperlipidemia, unspecified: Secondary | ICD-10-CM | POA: Diagnosis not present

## 2021-05-10 DIAGNOSIS — Z0000 Encounter for general adult medical examination without abnormal findings: Secondary | ICD-10-CM | POA: Diagnosis not present

## 2021-05-10 DIAGNOSIS — Z9181 History of falling: Secondary | ICD-10-CM | POA: Diagnosis not present

## 2021-05-10 DIAGNOSIS — Z1331 Encounter for screening for depression: Secondary | ICD-10-CM | POA: Diagnosis not present

## 2021-05-30 DIAGNOSIS — Z6841 Body Mass Index (BMI) 40.0 and over, adult: Secondary | ICD-10-CM | POA: Diagnosis not present

## 2021-05-30 DIAGNOSIS — M1711 Unilateral primary osteoarthritis, right knee: Secondary | ICD-10-CM | POA: Diagnosis not present

## 2021-05-30 DIAGNOSIS — M170 Bilateral primary osteoarthritis of knee: Secondary | ICD-10-CM | POA: Diagnosis not present

## 2021-05-30 DIAGNOSIS — M1712 Unilateral primary osteoarthritis, left knee: Secondary | ICD-10-CM | POA: Diagnosis not present

## 2021-07-05 ENCOUNTER — Other Ambulatory Visit: Payer: Self-pay

## 2021-07-05 ENCOUNTER — Encounter (HOSPITAL_COMMUNITY): Payer: Self-pay

## 2021-07-05 ENCOUNTER — Emergency Department (HOSPITAL_COMMUNITY): Payer: Medicare HMO

## 2021-07-05 ENCOUNTER — Emergency Department (HOSPITAL_COMMUNITY)
Admission: EM | Admit: 2021-07-05 | Discharge: 2021-07-06 | Disposition: A | Discharge status: Home / Self Care | Payer: Medicare HMO | Attending: Emergency Medicine | Admitting: Emergency Medicine

## 2021-07-05 DIAGNOSIS — K449 Diaphragmatic hernia without obstruction or gangrene: Secondary | ICD-10-CM | POA: Diagnosis not present

## 2021-07-05 DIAGNOSIS — Z20822 Contact with and (suspected) exposure to covid-19: Secondary | ICD-10-CM | POA: Diagnosis not present

## 2021-07-05 DIAGNOSIS — D649 Anemia, unspecified: Secondary | ICD-10-CM | POA: Insufficient documentation

## 2021-07-05 DIAGNOSIS — I129 Hypertensive chronic kidney disease with stage 1 through stage 4 chronic kidney disease, or unspecified chronic kidney disease: Secondary | ICD-10-CM | POA: Insufficient documentation

## 2021-07-05 DIAGNOSIS — N183 Chronic kidney disease, stage 3 unspecified: Secondary | ICD-10-CM | POA: Insufficient documentation

## 2021-07-05 DIAGNOSIS — Z79899 Other long term (current) drug therapy: Secondary | ICD-10-CM | POA: Diagnosis not present

## 2021-07-05 DIAGNOSIS — R0602 Shortness of breath: Secondary | ICD-10-CM | POA: Insufficient documentation

## 2021-07-05 DIAGNOSIS — Z87891 Personal history of nicotine dependence: Secondary | ICD-10-CM | POA: Diagnosis not present

## 2021-07-05 DIAGNOSIS — R059 Cough, unspecified: Secondary | ICD-10-CM | POA: Diagnosis not present

## 2021-07-05 DIAGNOSIS — Z86711 Personal history of pulmonary embolism: Secondary | ICD-10-CM | POA: Diagnosis not present

## 2021-07-05 DIAGNOSIS — I517 Cardiomegaly: Secondary | ICD-10-CM | POA: Diagnosis not present

## 2021-07-05 DIAGNOSIS — R509 Fever, unspecified: Secondary | ICD-10-CM | POA: Diagnosis not present

## 2021-07-05 DIAGNOSIS — J9811 Atelectasis: Secondary | ICD-10-CM | POA: Diagnosis not present

## 2021-07-05 LAB — CBC WITH DIFFERENTIAL/PLATELET
Abs Immature Granulocytes: 0.04 10*3/uL (ref 0.00–0.07)
Basophils Absolute: 0.1 10*3/uL (ref 0.0–0.1)
Basophils Relative: 1 %
Eosinophils Absolute: 0.2 10*3/uL (ref 0.0–0.5)
Eosinophils Relative: 2 %
HCT: 42.2 % (ref 36.0–46.0)
Hemoglobin: 13.1 g/dL (ref 12.0–15.0)
Immature Granulocytes: 0 %
Lymphocytes Relative: 13 %
Lymphs Abs: 1.2 10*3/uL (ref 0.7–4.0)
MCH: 28.5 pg (ref 26.0–34.0)
MCHC: 31 g/dL (ref 30.0–36.0)
MCV: 91.9 fL (ref 80.0–100.0)
Monocytes Absolute: 0.6 10*3/uL (ref 0.1–1.0)
Monocytes Relative: 7 %
Neutro Abs: 7.3 10*3/uL (ref 1.7–7.7)
Neutrophils Relative %: 77 %
Platelets: 268 10*3/uL (ref 150–400)
RBC: 4.59 MIL/uL (ref 3.87–5.11)
RDW: 14.9 % (ref 11.5–15.5)
WBC: 9.4 10*3/uL (ref 4.0–10.5)
nRBC: 0 % (ref 0.0–0.2)

## 2021-07-05 LAB — BASIC METABOLIC PANEL
Anion gap: 13 (ref 5–15)
BUN: 15 mg/dL (ref 8–23)
CO2: 23 mmol/L (ref 22–32)
Calcium: 8.9 mg/dL (ref 8.9–10.3)
Chloride: 100 mmol/L (ref 98–111)
Creatinine, Ser: 1.04 mg/dL — ABNORMAL HIGH (ref 0.44–1.00)
GFR, Estimated: 54 mL/min — ABNORMAL LOW (ref >60)
Glucose, Bld: 97 mg/dL (ref 70–99)
Potassium: 4.4 mmol/L (ref 3.5–5.1)
Sodium: 136 mmol/L (ref 135–145)

## 2021-07-05 LAB — RESP PANEL BY RT-PCR (FLU A&B, COVID) ARPGX2
Influenza A by PCR: NEGATIVE
Influenza B by PCR: NEGATIVE
SARS Coronavirus 2 by RT PCR: NEGATIVE

## 2021-07-05 LAB — TROPONIN I (HIGH SENSITIVITY)
Troponin I (High Sensitivity): 7 ng/L (ref <18)
Troponin I (High Sensitivity): 8 ng/L (ref <18)

## 2021-07-05 LAB — BRAIN NATRIURETIC PEPTIDE: B Natriuretic Peptide: 29.6 pg/mL (ref 0.0–100.0)

## 2021-07-05 MED ORDER — ALBUTEROL SULFATE HFA 108 (90 BASE) MCG/ACT IN AERS
1.0000 | INHALATION_SPRAY | Freq: Once | RESPIRATORY_TRACT | Status: AC
  Administered 2021-07-05: 1 via RESPIRATORY_TRACT
  Filled 2021-07-05: qty 6.7

## 2021-07-05 MED ORDER — ACETAMINOPHEN 325 MG PO TABS
650.0000 mg | ORAL_TABLET | Freq: Once | ORAL | Status: AC
  Administered 2021-07-05: 650 mg via ORAL
  Filled 2021-07-05: qty 2

## 2021-07-05 MED ORDER — IPRATROPIUM-ALBUTEROL 0.5-2.5 (3) MG/3ML IN SOLN
3.0000 mL | Freq: Once | RESPIRATORY_TRACT | Status: AC
  Administered 2021-07-06: 3 mL via RESPIRATORY_TRACT
  Filled 2021-07-05: qty 3

## 2021-07-05 MED ORDER — TRAMADOL HCL 50 MG PO TABS: 50.0000 mg | ORAL_TABLET | Freq: Once | ORAL | Status: DC

## 2021-07-05 NOTE — ED Provider Notes (Signed)
[facility] Provider Note   CSN: [id number] Arrival date & time: 07/05/21  1506     History Chief Complaint  Patient presents with   Shortness of Breath   Flu Like Symptoms    Alison York is a 80 y.o. female presents the emergency department for evaluation of shortness of breath and chest pain with cough and pleuritic chest pain starting today.  Patient reports she is also had a cough that "tasted like metal", but denies coughing up any blood.  Additionally, she mentions she has a sore throat, but no fever.  Patient has a history of anxiety, GERD, pulmonary fibrosis, pulmonary embolism, not on a blood thinner.  Patient is on at home albuterol, but ran out a few days ago.  No other medications trialed.  No radiation of pain.  Patient reports shortness of breath worsened on exertion, relieved with rest.  She denies any leg swelling, recent long travel, exogenous hormone use.  Denies any new medications.    Shortness of Breath Associated symptoms: cough and sore throat   Associated symptoms: no abdominal pain, no chest pain, no ear pain, no fever, no headaches, no rash and no vomiting       Past Medical History:  Diagnosis Date   Anemia    Anxiety state, unspecified    Cataract    Cerebral hemorrhage ([place]) 1995   Dysphagia    Erosive esophagitis    Esophageal reflux    Esophageal stricture    Hiatal hernia    Hypercholesterolemia    Hypertension    Internal hemorrhoids    Osteoporosis    Personal history of colonic polyps 1999 & 11/04/2011   villous adenoma & tubular adenoma   Prediabetes    Pulmonary embolism ([place]) 2020   Restless legs syndrome (RLS)    Shortness of breath    Vitamin D deficiency     Patient Active Problem List   Diagnosis Date Noted   Iron deficiency anemia 10/05/2020   Chronic cough 10/05/2020   Heme positive stool 10/05/2020   Pulmonary embolism ([place]) 05/19/2019   Hypercholesterolemia 02/04/2018   CKD  (chronic kidney disease) stage 3, GFR 30-59 ml/min (HCC) 02/04/2018   Esophageal reflux 11/04/2011   Personal history of colonic polyps 11/04/2011   Benign neoplasm of colon 11/04/2011   Dysphagia 10/25/2011   History of colon polyps 10/25/2011   GERD (gastroesophageal reflux disease) 10/25/2011   Obesity, Class III, BMI 40-49.9 (morbid obesity) ([place]) 10/25/2011    Past Surgical History:  Procedure Laterality Date   BREAST BIOPSY     bilateral   CHONDROPLASTY  11/05/2010   COLONOSCOPY  2018   KNEE ARTHROSCOPY W/ MENISCECTOMY  11/05/2010   PARTIAL HYSTERECTOMY  age 46-35   POLYPECTOMY     SHOULDER SURGERY Right 2020   TONSILLECTOMY       OB History   No obstetric history on file.     Family History  Problem Relation Age of Onset   Hypertension Father    Stroke Father        ministrokes start in 70's   Tuberculosis Mother    CAD Mother    Breast cancer Sister    Heart failure Sister        possible CAD   Diabetes Sister    Mental illness Sister    Alzheimer's disease Sister    Breast cancer Daughter    Kidney cancer Brother    Colon cancer Neg Hx  Colon polyps Neg Hx    Esophageal cancer Neg Hx    Rectal cancer Neg Hx    Stomach cancer Neg Hx     Social History   Tobacco Use   Smoking status: Former    Types: Cigarettes    Quit date: 08/26/1972    Years since quitting: 48.8   Smokeless tobacco: Never  Vaping Use   Vaping Use: Never used  Substance Use Topics   Alcohol use: No   Drug use: No    Home Medications Prior to Admission medications   Medication Sig Start Date End Date Taking? Authorizing Provider  ipratropium-albuterol (DUONEB) 0.5-2.5 (3) MG/3ML SOLN Take 3 mLs by nebulization every 6 (six) hours as needed. 07/06/21  Yes Sherrell Puller, PA-C  acetaminophen (TYLENOL) 650 MG CR tablet Take 650 mg by mouth every 8 (eight) hours as needed for pain.    [provider]  albuterol (VENTOLIN HFA) 108 (90 Base) MCG/ACT inhaler Inhale 2 puffs  into the lungs every 4 (four) hours as needed for wheezing or shortness of breath.    [provider]  ALPRAZolam Duanne Moron) 0.5 MG tablet Take 0.5 mg by mouth every 8 (eight) hours as needed for anxiety.     [provider]  Calcium-Vitamin D 600-125 MG-UNIT TABS Take 1 tablet by mouth 2 (two) times a week.     [provider]  ferrous sulfate 325 (65 FE) MG tablet Take 325 mg by mouth daily with breakfast.    [provider]  Multiple Vitamin (MULTIVITAMIN) capsule Take 1 capsule by mouth 2 (two) times a week.     [provider]  omeprazole (PRILOSEC) 40 MG capsule Take 1 capsule (40 mg total) by mouth 2 (two) times daily. 10/05/20   Zehr, Laban Emperor, PA-C  PARoxetine (PAXIL) 20 MG tablet Take 20 mg by mouth daily at 3 pm.     [provider]  pramipexole (MIRAPEX) 0.25 MG tablet  03/20/20   [provider]  pravastatin (PRAVACHOL) 80 MG tablet Take 1 tablet (80 mg total) by mouth every evening. 02/10/18 06/21/20  Daune Perch, NP    Allergies    Oxycodone and Simvastatin  Review of Systems   Review of Systems  Constitutional:  Positive for chills. Negative for fever.  HENT:  Positive for congestion, rhinorrhea and sore throat. Negative for ear pain and trouble swallowing.   Eyes:  Negative for pain and visual disturbance.  Respiratory:  Positive for cough and shortness of breath.   Cardiovascular:  Negative for chest pain and palpitations.  Gastrointestinal:  Negative for abdominal pain, constipation, diarrhea, nausea and vomiting.  Genitourinary:  Negative for dysuria, flank pain, hematuria, vaginal bleeding and vaginal discharge.  Musculoskeletal:  Negative for arthralgias, back pain and myalgias.  Skin:  Negative for color change and rash.  Neurological:  Negative for seizures, syncope and headaches.  All other systems reviewed and are negative.  Physical Exam Updated Vital Signs BP (!) 134/46   Pulse (!) 101   Temp  99.2 F (37.3 C) (Oral)   Resp 16   Ht 5\' 5"  (1.651 m)   Wt 108.9 kg   SpO2 95%   BMI 39.94 kg/m   Physical Exam Vitals and nursing note reviewed.  Constitutional:      General: She is not in acute distress.    Appearance: Normal appearance. She is not toxic-appearing.  HENT:     Head: Normocephalic and atraumatic.  Eyes:     General:  No scleral icterus. Cardiovascular:     Rate and Rhythm: Normal rate and regular rhythm.     Pulses: Normal pulses.  Pulmonary:     Effort: Pulmonary effort is normal. No tachypnea, accessory muscle usage or respiratory distress.     Comments: Inspiratory wheezing heard all lung fields.  Fine expiratory wheezing with coarse rhonchi heard in bilateral bases, right worse than left.  Patient is on any respiratory distress, no tripoding, accessory muscle use, cyanosis, or nasal flaring present.  Patient speaking in full sentences with ease. Abdominal:     General: Abdomen is flat. Bowel sounds are normal.     Palpations: Abdomen is soft.     Tenderness: There is no guarding.     Comments: Abdominal exam limited secondary to body habitus.  Musculoskeletal:        General: No deformity.     Cervical back: Normal range of motion.     Right lower leg: No tenderness. No edema.     Left lower leg: No tenderness. No edema.  Skin:    General: Skin is warm and dry.  Neurological:     General: No focal deficit present.     Mental Status: She is alert. Mental status is at baseline.    ED Results / Procedures / Treatments   Labs (all labs ordered are listed, but only abnormal results are displayed) Labs Reviewed  BASIC METABOLIC PANEL - Abnormal; Notable for the following components:      Result Value   Creatinine, Ser 1.04 (*)    GFR, Estimated 54 (*)    All other components within normal limits  RESP PANEL BY RT-PCR (FLU A&B, COVID) ARPGX2  CBC WITH DIFFERENTIAL/PLATELET  BRAIN NATRIURETIC PEPTIDE  TROPONIN I (HIGH SENSITIVITY)  TROPONIN I (HIGH  SENSITIVITY)    EKG EKG Interpretation  Date/Time:  Thursday July 05 2021 15:37:14 EST Ventricular Rate:  91 PR Interval:  142 QRS Duration: 144 QT Interval:  386 QTC Calculation: 474 R Axis:   -50 Text Interpretation: Normal sinus rhythm Right bundle branch block Left anterior fascicular block ** Bifascicular block ** Abnormal ECG Confirmed by Regan Lemming ([id number]) on 07/06/2021 12:06:56 AM  Radiology DG Chest 2 View  Result Date: 07/05/2021 CLINICAL DATA:  Cough, fever, left arm pain EXAM: CHEST - 2 VIEW COMPARISON:  11/05/2020 FINDINGS: Unchanged mild cardiomegaly. Unchanged mediastinal contours. Aortic atherosclerosis. No focal pulmonary opacity. No definite pleural effusion. Status post right shoulder arthroplasty. Redemonstrated small hiatal hernia. IMPRESSION: No active cardiopulmonary disease. Electronically Signed   By: Merilyn Baba M.D.   On: 07/05/2021 16:11   CT Angio Chest PE W and/or Wo Contrast  Result Date: 07/06/2021 CLINICAL DATA:  PE suspected, high probability. Shortness of breath. EXAM: CT ANGIOGRAPHY CHEST WITH CONTRAST TECHNIQUE: Multidetector CT imaging of the chest was performed using the standard protocol during bolus administration of intravenous contrast. Multiplanar CT image reconstructions and MIPs were obtained to evaluate the vascular anatomy. CONTRAST:  39mL OMNIPAQUE IOHEXOL 350 MG/ML SOLN COMPARISON:  05/19/2019. FINDINGS: Cardiovascular: The heart is enlarged and there is no pericardial effusion. A few scattered coronary artery calcifications are noted. There is atherosclerotic calcification of the aorta without evidence of aneurysm. The pulmonary trunk is normal in caliber. No large central pulmonary artery filling defect. Evaluation of the segmental and subsegmental arteries is limited due to mixing artifact and respiratory motion. Mediastinum/Nodes: Shotty lymph nodes are present in the mediastinum. There are nonspecific prominent hilar lymph nodes  bilaterally. No axillary lymphadenopathy  is seen. The thyroid gland, trachea, and esophagus are within normal limits. There is a moderate to large hiatal hernia. Lungs/Pleura: Subpleural fibrotic changes and atelectasis are noted bilaterally. No effusion or pneumothorax is seen. Upper Abdomen: There is a moderate to large hiatal hernia. Musculoskeletal: Shoulder arthroplasty changes are noted on the right. There is diffusely decreased mineralization of the bones. Mild degenerative changes are present in the thoracic spine. No acute osseous abnormality is seen. Review of the MIP images confirms the above findings. IMPRESSION: 1. No evidence of pulmonary embolism. 2. No acute process in the chest. 3. Moderate to large hiatal hernia. Electronically Signed   By: Brett Fairy M.D.   On: 07/06/2021 01:31    Procedures Procedures   Medications Ordered in ED Medications  albuterol (VENTOLIN HFA) 108 (90 Base) MCG/ACT inhaler 1 puff (1 puff Inhalation Given 07/05/21 1552)  ipratropium-albuterol (DUONEB) 0.5-2.5 (3) MG/3ML nebulizer solution 3 mL (3 mLs Nebulization Given 07/06/21 0100)  acetaminophen (TYLENOL) tablet 650 mg (650 mg Oral Given 07/05/21 2234)  iohexol (OMNIPAQUE) 350 MG/ML injection 75 mL (75 mLs Intravenous Contrast Given 07/06/21 0113)    ED Course  I have reviewed the triage vital signs and the nursing notes.  Pertinent labs & imaging results that were available during my care of the patient were reviewed by me and considered in my medical decision making (see chart for details).  80 year old female with history of PE, pulmonary fibrosis, GERD, and anxiety presents emergency department for evaluation of shortness of breath, dyspnea on exertion, pleuritic chest pain and chest pain after cough.  Differential diagnosis includes but is not limited to pneumonia, viral illness, COVID, flu, pulmonary embolism, pneumothorax, ACS.  On my exam, patient is not in respiratory distress.  Speaking in  full sentences with ease.  Her pulmonary exam revealed inspiratory wheezing, expiratory wheezing, with end coarse rhonchi right greater than left.  Not tachycardic.  I personally reviewed and interpreted the patient's labs and imaging.  Respiratory negative for flu and COVID.  CBC shows no leukocytosis or anemia.  BMP within normal limits.  Initial troponin 7 with repeat of 8.  BMP shows mildly elevated creatinine at 1.04 appears to be patient's baseline in comparison to previous lab, although from 2 years ago.  Chest x-ray shows no cardiopulmonary process.  Will obtain CTA of chest to rule out pneumonia and/or PE.  No pneumothorax visualized on chest x-ray.  EKG shows normal sinus rhythm with bifascicular block.  Patient is not bradycardic.  In these findings, ACS less likely due to flat troponins.  COVID and flu less likely due to negative respiratory panel.  Nurse and CT tech were unable to start an IV on the patient.  IV team had to be called in.  Line obtained.  Pending CTA.  On reevaluation, patient endorsed that she was experiencing a headache.  Tylenol 650 mg ordered.  She had some relief with the albuterol inhaler given to her while in triage.  Patient will receive a DuoNeb treatment after returning from CT.  CTA chest shows no evidence of pulmonary embolism or acute process in the chest.  Moderate to large hiatal hernia visualized.  Subpleural fibrotic changes and atelectasis noted bilaterally.  On reevaluation, patient received DuoNeb and reports she is feeling much better.  Lung exam improved.  Patient remains adequately oxygenating and has remained above 95% during her ER stay.  She reports she feels better and would like to go home.  Will prescribe her duo nebs to  take home as she has a nebulizer machine.  Recommended she follow-up with her PCP within the week for reevaluation and possible addition of inhalers.  Strict return precautions given.  Patient agrees to plan.  Patient is stable and  being discharged home in good condition.    MDM Rules/Calculators/A&P                         Final Clinical Impression(s) / ED Diagnoses Final diagnoses:  Shortness of breath    Rx / DC Orders ED Discharge Orders          Ordered    ipratropium-albuterol (DUONEB) 0.5-2.5 (3) MG/3ML SOLN  Every 6 hours PRN        07/06/21 0146             Sherrell Puller, PA-C 07/06/21 0147    Regan Lemming, MD 07/06/21 [phone]

## 2021-07-05 NOTE — ED Triage Notes (Signed)
Pt arrives POV for SOB onset this AM. Pt reports she started feeling badly yesterday "as though she was getting a cold". States today started w/ DOE. Reports out of her albuterol yesterday.Cough, runny nose, chills.

## 2021-07-05 NOTE — ED Provider Notes (Signed)
Emergency Medicine Provider Triage Evaluation Note  Alison York , a 80 y.o. female  was evaluated in triage.  Pt complains of shortness of breath.  Started yesterday, associated with cough and congestion.  Usually uses an albuterol inhaler for pulmonary fibrosis, ran out last night.  Also endorses left-sided chest pain that radiates to the left arm..  Review of Systems  Positive: Cough, shortness of breath, chest pain Negative: Nausea  Physical Exam  BP (!) 161/89 (BP Location: Right Arm)   Pulse 89   Temp 99.5 F (37.5 C)   Resp (!) 24   Ht 5\' 5"  (1.651 m)   Wt 108.9 kg   SpO2 98%   BMI 39.94 kg/m  Gen:   Awake, no distress   Resp:  Tachypnea, return expiratory wheezing to the upper left lung field MSK:   Moves extremities without difficulty  Other:    Medical Decision Making  Medically screening exam initiated at 3:45 PM.  Appropriate orders placed.  Alison York was informed that the remainder of the evaluation will be completed by another provider, this initial triage assessment does not replace that evaluation, and the importance of remaining in the ED until their evaluation is complete.  Check troponin.  Check for possible ACS, shortness of breath work-up   Sherrill Raring, PA-C 07/05/21 1546    Lennice Sites, DO 07/05/21 1546

## 2021-07-05 NOTE — ED Notes (Signed)
Pt ambulatory to restroom

## 2021-07-06 ENCOUNTER — Emergency Department (HOSPITAL_COMMUNITY): Payer: Medicare HMO

## 2021-07-06 DIAGNOSIS — J9811 Atelectasis: Secondary | ICD-10-CM | POA: Diagnosis not present

## 2021-07-06 DIAGNOSIS — R0602 Shortness of breath: Secondary | ICD-10-CM | POA: Diagnosis not present

## 2021-07-06 DIAGNOSIS — K449 Diaphragmatic hernia without obstruction or gangrene: Secondary | ICD-10-CM | POA: Diagnosis not present

## 2021-07-06 MED ORDER — IPRATROPIUM-ALBUTEROL 0.5-2.5 (3) MG/3ML IN SOLN: 3.0000 mL | Freq: Four times a day (QID) | RESPIRATORY_TRACT | 0 refills | Status: DC | PRN

## 2021-07-06 MED ORDER — IOHEXOL 350 MG/ML SOLN
75.0000 mL | Freq: Once | INTRAVENOUS | Status: AC | PRN
  Administered 2021-07-06: 75 mL via INTRAVENOUS

## 2021-07-06 NOTE — Discharge Instructions (Addendum)
You were seen here today for evaluation of your shortness of breath and flulike symptoms. Negative COVID and flu test.  Your CT of your chest did not show a pulmonary embolism or pneumonia.  This is likely an exacerbation of your pulmonary fibrosis and/or a viral illness.  Continue to use your albuterol and duo nebs as needed for symptoms.  Please follow-up with your primary care provider within the week reevaluation.  If you have any concern, new or worsening symptoms, please return to the nearest emergency department for reevaluation.

## 2021-07-06 NOTE — ED Notes (Signed)
Patient verbalizes understanding of discharge instructions. Opportunity for questioning and answers were provided. Armband removed by staff, pt discharged from ED via wheelchair.  

## 2021-07-06 NOTE — ED Notes (Signed)
Pt to CT

## 2021-07-16 DIAGNOSIS — J4521 Mild intermittent asthma with (acute) exacerbation: Secondary | ICD-10-CM | POA: Diagnosis not present

## 2021-07-16 DIAGNOSIS — R059 Cough, unspecified: Secondary | ICD-10-CM | POA: Diagnosis not present

## 2021-07-16 DIAGNOSIS — B974 Respiratory syncytial virus as the cause of diseases classified elsewhere: Secondary | ICD-10-CM | POA: Diagnosis not present

## 2021-07-16 DIAGNOSIS — G4733 Obstructive sleep apnea (adult) (pediatric): Secondary | ICD-10-CM | POA: Diagnosis not present

## 2021-07-16 DIAGNOSIS — J8410 Pulmonary fibrosis, unspecified: Secondary | ICD-10-CM | POA: Diagnosis not present

## 2021-07-16 DIAGNOSIS — J301 Allergic rhinitis due to pollen: Secondary | ICD-10-CM | POA: Diagnosis not present

## 2021-07-16 DIAGNOSIS — G2581 Restless legs syndrome: Secondary | ICD-10-CM | POA: Diagnosis not present

## 2021-07-16 DIAGNOSIS — J069 Acute upper respiratory infection, unspecified: Secondary | ICD-10-CM | POA: Diagnosis not present

## 2021-07-16 DIAGNOSIS — R5383 Other fatigue: Secondary | ICD-10-CM | POA: Diagnosis not present

## 2021-07-23 DIAGNOSIS — M549 Dorsalgia, unspecified: Secondary | ICD-10-CM | POA: Diagnosis not present

## 2021-07-23 DIAGNOSIS — J45909 Unspecified asthma, uncomplicated: Secondary | ICD-10-CM | POA: Diagnosis not present

## 2021-07-23 DIAGNOSIS — Z79899 Other long term (current) drug therapy: Secondary | ICD-10-CM | POA: Diagnosis not present

## 2021-07-23 DIAGNOSIS — J8410 Pulmonary fibrosis, unspecified: Secondary | ICD-10-CM | POA: Diagnosis not present

## 2021-07-23 DIAGNOSIS — Z23 Encounter for immunization: Secondary | ICD-10-CM | POA: Diagnosis not present

## 2021-07-23 DIAGNOSIS — R0602 Shortness of breath: Secondary | ICD-10-CM | POA: Diagnosis not present

## 2021-07-23 DIAGNOSIS — Z6841 Body Mass Index (BMI) 40.0 and over, adult: Secondary | ICD-10-CM | POA: Diagnosis not present

## 2021-07-25 DIAGNOSIS — G2581 Restless legs syndrome: Secondary | ICD-10-CM | POA: Diagnosis not present

## 2021-07-25 DIAGNOSIS — J301 Allergic rhinitis due to pollen: Secondary | ICD-10-CM | POA: Diagnosis not present

## 2021-07-25 DIAGNOSIS — J4520 Mild intermittent asthma, uncomplicated: Secondary | ICD-10-CM | POA: Diagnosis not present

## 2021-07-25 DIAGNOSIS — J8410 Pulmonary fibrosis, unspecified: Secondary | ICD-10-CM | POA: Diagnosis not present

## 2021-07-25 DIAGNOSIS — R5383 Other fatigue: Secondary | ICD-10-CM | POA: Diagnosis not present

## 2021-07-25 DIAGNOSIS — G4733 Obstructive sleep apnea (adult) (pediatric): Secondary | ICD-10-CM | POA: Diagnosis not present

## 2021-08-22 DIAGNOSIS — R5383 Other fatigue: Secondary | ICD-10-CM | POA: Diagnosis not present

## 2021-08-22 DIAGNOSIS — I2721 Secondary pulmonary arterial hypertension: Secondary | ICD-10-CM | POA: Diagnosis not present

## 2021-08-22 DIAGNOSIS — J8410 Pulmonary fibrosis, unspecified: Secondary | ICD-10-CM | POA: Diagnosis not present

## 2021-08-22 DIAGNOSIS — J301 Allergic rhinitis due to pollen: Secondary | ICD-10-CM | POA: Diagnosis not present

## 2021-08-22 DIAGNOSIS — G4733 Obstructive sleep apnea (adult) (pediatric): Secondary | ICD-10-CM | POA: Diagnosis not present

## 2021-08-22 DIAGNOSIS — G2581 Restless legs syndrome: Secondary | ICD-10-CM | POA: Diagnosis not present

## 2021-08-22 DIAGNOSIS — J4520 Mild intermittent asthma, uncomplicated: Secondary | ICD-10-CM | POA: Diagnosis not present

## 2021-09-09 DIAGNOSIS — J209 Acute bronchitis, unspecified: Secondary | ICD-10-CM | POA: Diagnosis not present

## 2021-09-09 DIAGNOSIS — R051 Acute cough: Secondary | ICD-10-CM | POA: Diagnosis not present

## 2021-10-04 DIAGNOSIS — D509 Iron deficiency anemia, unspecified: Secondary | ICD-10-CM | POA: Diagnosis not present

## 2021-10-04 DIAGNOSIS — E7800 Pure hypercholesterolemia, unspecified: Secondary | ICD-10-CM | POA: Diagnosis not present

## 2021-10-04 DIAGNOSIS — R7303 Prediabetes: Secondary | ICD-10-CM | POA: Diagnosis not present

## 2021-10-04 DIAGNOSIS — F419 Anxiety disorder, unspecified: Secondary | ICD-10-CM | POA: Diagnosis not present

## 2021-10-04 DIAGNOSIS — G2581 Restless legs syndrome: Secondary | ICD-10-CM | POA: Diagnosis not present

## 2021-10-04 DIAGNOSIS — K219 Gastro-esophageal reflux disease without esophagitis: Secondary | ICD-10-CM | POA: Diagnosis not present

## 2021-10-04 DIAGNOSIS — E538 Deficiency of other specified B group vitamins: Secondary | ICD-10-CM | POA: Diagnosis not present

## 2021-10-04 DIAGNOSIS — J8410 Pulmonary fibrosis, unspecified: Secondary | ICD-10-CM | POA: Diagnosis not present

## 2021-10-04 DIAGNOSIS — N183 Chronic kidney disease, stage 3 unspecified: Secondary | ICD-10-CM | POA: Diagnosis not present

## 2021-10-04 DIAGNOSIS — Z79899 Other long term (current) drug therapy: Secondary | ICD-10-CM | POA: Diagnosis not present

## 2021-10-18 ENCOUNTER — Other Ambulatory Visit: Payer: Self-pay

## 2021-10-18 ENCOUNTER — Ambulatory Visit: Payer: Medicare HMO | Admitting: Pulmonary Disease

## 2021-10-18 ENCOUNTER — Encounter: Payer: Self-pay | Admitting: Pulmonary Disease

## 2021-10-18 VITALS — BP 116/74 | HR 80 | Ht 65.0 in | Wt 251.0 lb

## 2021-10-18 DIAGNOSIS — J4530 Mild persistent asthma, uncomplicated: Secondary | ICD-10-CM

## 2021-10-18 DIAGNOSIS — J849 Interstitial pulmonary disease, unspecified: Secondary | ICD-10-CM | POA: Diagnosis not present

## 2021-10-18 MED ORDER — BUDESONIDE 0.5 MG/2ML IN SUSP: 0.5000 mg | Freq: Two times a day (BID) | RESPIRATORY_TRACT | 11 refills | Status: DC

## 2021-10-18 MED ORDER — IPRATROPIUM-ALBUTEROL 0.5-2.5 (3) MG/3ML IN SOLN: 3.0000 mL | Freq: Four times a day (QID) | RESPIRATORY_TRACT | 0 refills | Status: DC | PRN

## 2021-10-18 NOTE — Patient Instructions (Addendum)
We will request your pulmonary function tests results from [facility]  We will schedule you for a High resolution CT chest scan at one of the [facility] imaging centers  Start budesonide nebulizer treatments twice daily  Use duo-neb nebulizer treatments 3 times daily (every 6 hours)  Complete current prendisone taper as directed  Follow up in 4 - 6 weeks to discuss CT chest results and PFT records.

## 2021-10-18 NOTE — Progress Notes (Signed)
Synopsis: Referred in February 2023 for pulmonary fibrosis by Cyndi Bender, PA  Subjective:   PATIENT ID: Alison York: female DOB: [birth year], MRN: [id number]  HPI  Chief Complaint  Patient presents with   Consult    Referred by PCP for pulmonary fibrosis. States she has noticed an increase in SOB and wheezing.    Alison York is an 81 year old woman, former smoker with CKD stage III, pulmonary embolus, GERD and asthma who was referred to pulmonary clinic for evaluation of pulmonary fibrosis.  She has been followed by Dr. Alcide Clever at [facility] pulmonology who has diagnosed her with pulmonary fibrosis and has recommended pirfenidone therapy.  She wanted a second opinion and has been referred to our clinic.  She is currently taking a steroid taper which has significantly helped her breathing as it has eliminated cough and wheezing and reduced her shortness of breath.  She is currently using as needed albuterol inhaler or nebulizer treatments which provide some relief.  After finishing steroid tapers in the past her cough wheezing and shortness of breath have returned.  She has significant history of GERD and a moderate-sized hiatal hernia on chest imaging.  She had EGD in 2022 which was unremarkable except for the hiatal hernia.  She is taking omeprazole 40 mg daily.  She denies any seasonal allergies and she denies any sinus congestion or postnasal drainage.  She has about a 20-pack-year smoking history and quit at age 55.  She previously worked at [employer] with significant dust exposure.  No family history of lung disease.  Her son has multiple myeloma and her husband has recently been diagnosed with dementia.  Past Medical History:  Diagnosis Date   Anemia    Anxiety state, unspecified    Cataract    Cerebral hemorrhage ([place]) 1995   Dysphagia    Erosive esophagitis    Esophageal reflux    Esophageal stricture    Hiatal hernia    Hypercholesterolemia    Hypertension     Internal hemorrhoids    Osteoporosis    Personal history of colonic polyps 1999 & 11/04/2011   villous adenoma & tubular adenoma   Prediabetes    Pulmonary embolism ([place]) 2020   Restless legs syndrome (RLS)    Shortness of breath    Vitamin D deficiency      Family History  Problem Relation Age of Onset   Hypertension Father    Stroke Father        ministrokes start in 53's   Tuberculosis Mother    CAD Mother    Breast cancer Sister    Heart failure Sister        possible CAD   Diabetes Sister    Mental illness Sister    Alzheimer's disease Sister    Breast cancer Daughter    Kidney cancer Brother    Colon cancer Neg Hx    Colon polyps Neg Hx    Esophageal cancer Neg Hx    Rectal cancer Neg Hx    Stomach cancer Neg Hx      Social History   Socioeconomic History   Marital status: Married    Spouse name: Not on file   Number of children: 2   Years of education: Not on file   Highest education level: Not on file  Occupational History   Occupation: retired works part time at a [employer]   Smoking status: Former    Types: Cigarettes  Quit date: 08/26/1972    Years since quitting: 49.1   Smokeless tobacco: Never  Vaping Use   Vaping Use: Never used  Substance and Sexual Activity   Alcohol use: No   Drug use: No   Sexual activity: Not on file  Other Topics Concern   Not on file  Social History Narrative   Not on file   Social Determinants of Health   Financial Resource Strain: Not on file  Food Insecurity: Not on file  Transportation Needs: Not on file  Physical Activity: Not on file  Stress: Not on file  Social Connections: Not on file  Intimate Partner Violence: Not on file     Allergies  Allergen Reactions   Oxycodone Shortness Of Breath   Simvastatin Other (See Comments)    Leg pain     Outpatient Medications Prior to Visit  Medication Sig Dispense Refill   albuterol (VENTOLIN HFA) 108 (90 Base) MCG/ACT inhaler Inhale 2 puffs  into the lungs every 4 (four) hours as needed for wheezing or shortness of breath.     ALPRAZolam (XANAX) 0.5 MG tablet Take 0.5 mg by mouth every 8 (eight) hours as needed for anxiety.      Calcium-Vitamin D 600-125 MG-UNIT TABS Take 1 tablet by mouth 2 (two) times a week.      ferrous sulfate 325 (65 FE) MG tablet Take 325 mg by mouth daily with breakfast.     Multiple Vitamin (MULTIVITAMIN) capsule Take 1 capsule by mouth 2 (two) times a week.      omeprazole (PRILOSEC) 40 MG capsule Take 1 capsule (40 mg total) by mouth 2 (two) times daily. 60 capsule 3   PARoxetine (PAXIL) 20 MG tablet Take 20 mg by mouth daily at 3 pm.      pravastatin (PRAVACHOL) 80 MG tablet Take 1 tablet (80 mg total) by mouth every evening. 90 tablet 3   predniSONE (DELTASONE) 20 MG tablet Take 40 mg by mouth daily.     ipratropium-albuterol (DUONEB) 0.5-2.5 (3) MG/3ML SOLN Take 3 mLs by nebulization every 6 (six) hours as needed. 360 mL 0   acetaminophen (TYLENOL) 650 MG CR tablet Take 650 mg by mouth every 8 (eight) hours as needed for pain.     pramipexole (MIRAPEX) 0.25 MG tablet      No facility-administered medications prior to visit.   Review of Systems  Constitutional:  Negative for chills, fever, malaise/fatigue and weight loss.  HENT:  Negative for congestion, sinus pain and sore throat.   Eyes: Negative.   Respiratory:  Positive for cough, shortness of breath and wheezing. Negative for hemoptysis and sputum production.   Cardiovascular:  Negative for chest pain, palpitations, orthopnea, claudication and leg swelling.  Gastrointestinal:  Positive for heartburn. Negative for abdominal pain, nausea and vomiting.  Genitourinary: Negative.   Musculoskeletal:  Negative for joint pain and myalgias.  Skin:  Negative for rash.  Neurological:  Negative for weakness.  Endo/Heme/Allergies: Negative.   Psychiatric/Behavioral: Negative.     Objective:   Vitals:   10/18/21 1435  BP: 116/74  Pulse: 80  SpO2:  98%  Weight: 251 lb (113.9 kg)  Height: $Remove'5\' 5"'[id number]$  (1.651 m)   Physical Exam Constitutional:      General: She is not in acute distress.    Appearance: She is obese. She is not ill-appearing.  HENT:     Head: Normocephalic and atraumatic.  Eyes:     General: No scleral icterus.    Conjunctiva/sclera: Conjunctivae normal.  Pupils: Pupils are equal, round, and reactive to light.  Cardiovascular:     Rate and Rhythm: Normal rate and regular rhythm.     Pulses: Normal pulses.     Heart sounds: Normal heart sounds. No murmur heard. Pulmonary:     Effort: Pulmonary effort is normal.     Breath sounds: Rales (bibasilar) present. No wheezing or rhonchi.  Abdominal:     General: Bowel sounds are normal.     Palpations: Abdomen is soft.  Musculoskeletal:     Right lower leg: No edema.     Left lower leg: No edema.  Lymphadenopathy:     Cervical: No cervical adenopathy.  Skin:    General: Skin is warm and dry.  Neurological:     General: No focal deficit present.     Mental Status: She is alert.  Psychiatric:        Mood and Affect: Mood normal.        Behavior: Behavior normal.        Thought Content: Thought content normal.        Judgment: Judgment normal.   CBC    Component Value Date/Time   WBC 9.4 07/05/2021 1549   RBC 4.59 07/05/2021 1549   HGB 13.1 07/05/2021 1549   HCT 42.2 07/05/2021 1549   PLT 268 07/05/2021 1549   MCV 91.9 07/05/2021 1549   MCH 28.5 07/05/2021 1549   MCHC 31.0 07/05/2021 1549   RDW 14.9 07/05/2021 1549   LYMPHSABS 1.2 07/05/2021 1549   MONOABS 0.6 07/05/2021 1549   EOSABS 0.2 07/05/2021 1549   BASOSABS 0.1 07/05/2021 1549   BMP Latest Ref Rng & Units 07/05/2021 05/21/2019 05/20/2019  Glucose 70 - 99 mg/dL 97 121(H) 120(H)  BUN 8 - 23 mg/dL 15 18 15   Creatinine 0.44 - 1.00 mg/dL 1.04(H) 1.05(H) 0.96  Sodium 135 - 145 mmol/L 136 141 138  Potassium 3.5 - 5.1 mmol/L 4.4 4.6 3.9  Chloride 98 - 111 mmol/L 100 106 103  CO2 22 - 32 mmol/L 23 26 24    Calcium 8.9 - 10.3 mg/dL 8.9 9.0 8.7(L)   Chest imaging: CTA Chest 07/06/21 Cardiovascular: The heart is enlarged and there is no pericardial effusion. A few scattered coronary artery calcifications are noted. There is atherosclerotic calcification of the aorta without evidence of aneurysm. The pulmonary trunk is normal in caliber. No large central pulmonary artery filling defect. Evaluation of the segmental and subsegmental arteries is limited due to mixing artifact and respiratory motion.   Mediastinum/Nodes: Shotty lymph nodes are present in the mediastinum. There are nonspecific prominent hilar lymph nodes bilaterally. No axillary lymphadenopathy is seen. The thyroid gland, trachea, and esophagus are within normal limits. There is a moderate to large hiatal hernia.   Lungs/Pleura: Subpleural fibrotic changes and atelectasis are noted bilaterally. No effusion or pneumothorax is seen.   Upper Abdomen: There is a moderate to large hiatal hernia.  PFT: No flowsheet data found.  Labs:  Path:  Echo 2019: LV EF 65-70%. Grade I diastolic dysfunction. RV size and systolic normal.   Heart Catheterization:  Assessment & Plan:   Mild persistent reactive airway disease without complication - Plan: ipratropium-albuterol (DUONEB) 0.5-2.5 (3) MG/3ML SOLN, budesonide (PULMICORT) 0.5 MG/2ML nebulizer solution  ILD (interstitial lung disease) ([place]) - Plan: CT CHEST HIGH RESOLUTION  Discussion: Alison York is an 81 year old woman, former smoker with CKD stage III, pulmonary embolus, GERD and asthma who was referred to pulmonary clinic for evaluation of pulmonary fibrosis.  She does have some subpleural reticulation based on her recent CTA chest from November 2022.  She has recently had pulmonary function test at Dr. Maxie Barb office which we will request a copy of the results for further evaluation.  We will schedule her for a high-resolution CT chest scan for better evaluation of her  lung parenchyma.  At this time it appears her reactive airways disease or asthma is more active and it appears to be better controlled with steroid tapers.  We will start her on budesonide nebulizer treatments as she prefers nebulizers over inhalers.  She is to use DuoNeb nebulizer treatments 3 times daily.  I also believe that her GERD/hiatal hernia are aggravating her reactive airways disease.  She is to continue PPI therapy.  She is to start sleeping with the head of her bed elevated to reduce nocturnal reflux.  Follow-up in 4 to 6 weeks to review CT chest scan and PFT records.  Freda Jackson, MD [facility] Office: [phone]   Current Outpatient Medications:    albuterol (VENTOLIN HFA) 108 (90 Base) MCG/ACT inhaler, Inhale 2 puffs into the lungs every 4 (four) hours as needed for wheezing or shortness of breath., Disp: , Rfl:    ALPRAZolam (XANAX) 0.5 MG tablet, Take 0.5 mg by mouth every 8 (eight) hours as needed for anxiety. , Disp: , Rfl:    budesonide (PULMICORT) 0.5 MG/2ML nebulizer solution, Take 2 mLs (0.5 mg total) by nebulization 2 (two) times daily., Disp: 120 mL, Rfl: 11   Calcium-Vitamin D 600-125 MG-UNIT TABS, Take 1 tablet by mouth 2 (two) times a week. , Disp: , Rfl:    ferrous sulfate 325 (65 FE) MG tablet, Take 325 mg by mouth daily with breakfast., Disp: , Rfl:    Multiple Vitamin (MULTIVITAMIN) capsule, Take 1 capsule by mouth 2 (two) times a week. , Disp: , Rfl:    omeprazole (PRILOSEC) 40 MG capsule, Take 1 capsule (40 mg total) by mouth 2 (two) times daily., Disp: 60 capsule, Rfl: 3   PARoxetine (PAXIL) 20 MG tablet, Take 20 mg by mouth daily at 3 pm. , Disp: , Rfl:    pravastatin (PRAVACHOL) 80 MG tablet, Take 1 tablet (80 mg total) by mouth every evening., Disp: 90 tablet, Rfl: 3   predniSONE (DELTASONE) 20 MG tablet, Take 40 mg by mouth daily., Disp: , Rfl:    ipratropium-albuterol (DUONEB) 0.5-2.5 (3) MG/3ML SOLN, Take 3 mLs by  nebulization every 6 (six) hours as needed., Disp: 360 mL, Rfl: 0

## 2021-11-07 DIAGNOSIS — K219 Gastro-esophageal reflux disease without esophagitis: Secondary | ICD-10-CM | POA: Diagnosis not present

## 2021-11-07 DIAGNOSIS — J45909 Unspecified asthma, uncomplicated: Secondary | ICD-10-CM | POA: Diagnosis not present

## 2021-11-07 DIAGNOSIS — E538 Deficiency of other specified B group vitamins: Secondary | ICD-10-CM | POA: Diagnosis not present

## 2021-11-07 DIAGNOSIS — Z6841 Body Mass Index (BMI) 40.0 and over, adult: Secondary | ICD-10-CM | POA: Diagnosis not present

## 2021-11-07 DIAGNOSIS — J849 Interstitial pulmonary disease, unspecified: Secondary | ICD-10-CM | POA: Diagnosis not present

## 2021-11-07 DIAGNOSIS — I700 Atherosclerosis of aorta: Secondary | ICD-10-CM | POA: Diagnosis not present

## 2021-11-23 ENCOUNTER — Ambulatory Visit (INDEPENDENT_AMBULATORY_CARE_PROVIDER_SITE_OTHER)
Admission: RE | Admit: 2021-11-23 | Discharge: 2021-11-23 | Disposition: A | Discharge status: Home / Self Care | Payer: Medicare HMO | Source: Ambulatory Visit | Attending: Pulmonary Disease | Admitting: Pulmonary Disease

## 2021-11-23 DIAGNOSIS — I700 Atherosclerosis of aorta: Secondary | ICD-10-CM | POA: Diagnosis not present

## 2021-11-23 DIAGNOSIS — J849 Interstitial pulmonary disease, unspecified: Secondary | ICD-10-CM | POA: Diagnosis not present

## 2021-11-23 DIAGNOSIS — J479 Bronchiectasis, uncomplicated: Secondary | ICD-10-CM | POA: Diagnosis not present

## 2021-11-23 DIAGNOSIS — K449 Diaphragmatic hernia without obstruction or gangrene: Secondary | ICD-10-CM | POA: Diagnosis not present

## 2021-11-23 DIAGNOSIS — J84112 Idiopathic pulmonary fibrosis: Secondary | ICD-10-CM | POA: Diagnosis not present

## 2021-11-27 ENCOUNTER — Ambulatory Visit: Payer: Medicare HMO | Admitting: Pulmonary Disease

## 2021-11-27 ENCOUNTER — Encounter: Payer: Self-pay | Admitting: Pulmonary Disease

## 2021-11-27 VITALS — BP 116/78 | HR 74 | Ht 65.0 in | Wt 257.6 lb

## 2021-11-27 DIAGNOSIS — J849 Interstitial pulmonary disease, unspecified: Secondary | ICD-10-CM

## 2021-11-27 DIAGNOSIS — J4530 Mild persistent asthma, uncomplicated: Secondary | ICD-10-CM

## 2021-11-27 LAB — CK: Total CK: 74 U/L (ref 7–177)

## 2021-11-27 NOTE — Patient Instructions (Addendum)
Your CT Chest scan and pulmonary function tests are consistent with pulmonary fibrosis. ? ?I recommend starting an antifibrotic therapy. We will schedule you an appointment with our pharmacist, [facility] to review which medication is best for you to start.  ? ?Use the budesonide and duonebs nebulizer treatments as needed.  ? ?Recommend sleeping with the head of the bed elevated for your reflux.  ? ?Follow up in 4 months ? ? ?

## 2021-11-27 NOTE — Progress Notes (Signed)
? ?Synopsis: Referred in February 2023 for pulmonary fibrosis by Cyndi Bender, PA ? ?Subjective:  ? ?PATIENT ID: Alison York GENDER: female DOB: [birth year], MRN: [id number] ? ?HPI ? ?Chief Complaint  ?Patient presents with  ? Follow-up  ?  F/U after CT scan. States her breathing has improved since last visit.   ? ?Alison York is an 81 year old woman, former smoker with CKD stage III, pulmonary embolus, GERD and asthma who returns to pulmonary clinic for evaluation of pulmonary fibrosis. ? ?PFTs 01/02/21 from [facility] showed FEV1/FVC 81, FVC 2.19L (82%), FEV1 1.77L (87%), TLC 67% and DLCO 56% ? ?HRCT Chest 11/23/21 shows mild pulmonary fibrosis pattern, categorized as probable UIP. Mild tracheobronchomalacia and large hiatal hernia.  ? ?She reports her GERD is better with elevating the head of her bed.  ? ?She hasn't required nebulizer treatments since completing course of prednisone. Feels her breathing is stable at this time.  ? ?OV 10/18/21 ?She has been followed by Dr. Alcide Clever at [facility] pulmonology who has diagnosed her with pulmonary fibrosis and has recommended pirfenidone therapy.  She wanted a second opinion and has been referred to our clinic.  She is currently taking a steroid taper which has significantly helped her breathing as it has eliminated cough and wheezing and reduced her shortness of breath.  She is currently using as needed albuterol inhaler or nebulizer treatments which provide some relief.  After finishing steroid tapers in the past her cough wheezing and shortness of breath have returned. ? ?She has significant history of GERD and a moderate-sized hiatal hernia on chest imaging.  She had EGD in 2022 which was unremarkable except for the hiatal hernia.  She is taking omeprazole 40 mg daily. ? ?She denies any seasonal allergies and she denies any sinus congestion or postnasal drainage. ? ?She has about a 20-pack-year smoking history and quit at age 47.  She previously worked at [employer] with significant dust exposure.  No family history of lung disease.  Her son has multiple myeloma and her husband has recently been diagnosed with dementia. ? ?Past Medical History:  ?Diagnosis Date  ? Anemia   ? Anxiety state, unspecified   ? Cataract   ? Cerebral hemorrhage ([place]) 1995  ? Dysphagia   ? Erosive esophagitis   ? Esophageal reflux   ? Esophageal stricture   ? Hiatal hernia   ? Hypercholesterolemia   ? Hypertension   ? Internal hemorrhoids   ? Osteoporosis   ? Personal history of colonic polyps 1999 & 11/04/2011  ? villous adenoma & tubular adenoma  ? Prediabetes   ? Pulmonary embolism ([place]) 2020  ? Restless legs syndrome (RLS)   ? Shortness of breath   ? Vitamin D deficiency   ?  ? ?Family History  ?Problem Relation Age of Onset  ? Hypertension Father   ? Stroke Father   ?     ministrokes start in 69's  ? Tuberculosis Mother   ? CAD Mother   ? Breast cancer Sister   ? Heart failure Sister   ?     possible CAD  ? Diabetes Sister   ? Mental illness Sister   ? Alzheimer's disease Sister   ? Breast cancer Daughter   ? Kidney cancer Brother   ? Colon cancer Neg Hx   ? Colon polyps Neg Hx   ? Esophageal cancer Neg Hx   ? Rectal cancer Neg Hx   ? Stomach cancer Neg Hx   ?  ? ?  Social History  ? ?Socioeconomic History  ? Marital status: Married  ?  Spouse name: Not on file  ? Number of children: 2  ? Years of education: Not on file  ? Highest education level: Not on file  ?Occupational History  ? Occupation: retired works part time at [employer]  ?Tobacco Use  ? Smoking status: Former  ?  Types: Cigarettes  ?  Quit date: 08/26/1972  ?  Years since quitting: 49.2  ? Smokeless tobacco: Never  ?Vaping Use  ? Vaping Use: Never used  ?Substance and Sexual Activity  ? Alcohol use: No  ? Drug use: No  ? Sexual activity: Not on file  ?Other Topics Concern  ? Not on file  ?Social History Narrative  ? Not on file  ? ?Social Determinants of Health  ? ?Financial Resource Strain: Not on file  ?Food Insecurity: Not on  file  ?Transportation Needs: Not on file  ?Physical Activity: Not on file  ?Stress: Not on file  ?Social Connections: Not on file  ?Intimate Partner Violence: Not on file  ?  ? ?Allergies  ?Allergen Reactions  ? Oxycodone Shortness Of Breath  ? Simvastatin Other (See Comments)  ?  Leg pain  ?  ? ?Outpatient Medications Prior to Visit  ?Medication Sig Dispense Refill  ? albuterol (VENTOLIN HFA) 108 (90 Base) MCG/ACT inhaler Inhale 2 puffs into the lungs every 4 (four) hours as needed for wheezing or shortness of breath.    ? ALPRAZolam (XANAX) 0.5 MG tablet Take 0.5 mg by mouth every 8 (eight) hours as needed for anxiety.     ? budesonide (PULMICORT) 0.5 MG/2ML nebulizer solution Take 2 mLs (0.5 mg total) by nebulization 2 (two) times daily. 120 mL 11  ? Calcium-Vitamin D 600-125 MG-UNIT TABS Take 1 tablet by mouth 2 (two) times a week.     ? ferrous sulfate 325 (65 FE) MG tablet Take 325 mg by mouth daily with breakfast.    ? ipratropium-albuterol (DUONEB) 0.5-2.5 (3) MG/3ML SOLN Take 3 mLs by nebulization every 6 (six) hours as needed. 360 mL 0  ? Multiple Vitamin (MULTIVITAMIN) capsule Take 1 capsule by mouth 2 (two) times a week.     ? omeprazole (PRILOSEC) 40 MG capsule Take 1 capsule (40 mg total) by mouth 2 (two) times daily. 60 capsule 3  ? PARoxetine (PAXIL) 20 MG tablet Take 20 mg by mouth daily at 3 pm.     ? predniSONE (DELTASONE) 20 MG tablet Take 40 mg by mouth daily.    ? pravastatin (PRAVACHOL) 80 MG tablet Take 1 tablet (80 mg total) by mouth every evening. 90 tablet 3  ? ?No facility-administered medications prior to visit.  ? ?Review of Systems  ?Constitutional:  Negative for chills, fever, malaise/fatigue and weight loss.  ?HENT:  Negative for congestion, sinus pain and sore throat.   ?Eyes: Negative.   ?Respiratory:  Positive for cough, shortness of breath and wheezing. Negative for hemoptysis and sputum production.   ?Cardiovascular:  Negative for chest pain, palpitations, orthopnea,  claudication and leg swelling.  ?Gastrointestinal:  Positive for heartburn. Negative for abdominal pain, nausea and vomiting.  ?Genitourinary: Negative.   ?Musculoskeletal:  Negative for joint pain and myalgias.  ?Skin:  Negative for rash.  ?Neurological:  Negative for weakness.  ?Endo/Heme/Allergies: Negative.   ?Psychiatric/Behavioral: Negative.    ? ?Objective:  ? ?Vitals:  ? 11/27/21 1345  ?BP: 116/78  ?Pulse: 74  ?SpO2: 97%  ?Weight: 257 lb 9.6 oz (  116.8 kg)  ?Height: 5' 5"  (1.651 m)  ? ?Physical Exam ?Constitutional:   ?   General: She is not in acute distress. ?   Appearance: She is obese. She is not ill-appearing.  ?HENT:  ?   Head: Normocephalic and atraumatic.  ?Eyes:  ?   General: No scleral icterus. ?   Conjunctiva/sclera: Conjunctivae normal.  ?Cardiovascular:  ?   Rate and Rhythm: Normal rate and regular rhythm.  ?   Pulses: Normal pulses.  ?   Heart sounds: Normal heart sounds. No murmur heard. ?Pulmonary:  ?   Effort: Pulmonary effort is normal.  ?   Breath sounds: Rales (bibasilar) present. No wheezing or rhonchi.  ?Musculoskeletal:  ?   Right lower leg: No edema.  ?   Left lower leg: No edema.  ?Skin: ?   General: Skin is warm and dry.  ?Neurological:  ?   General: No focal deficit present.  ?   Mental Status: She is alert.  ?Psychiatric:     ?   Mood and Affect: Mood normal.     ?   Behavior: Behavior normal.     ?   Thought Content: Thought content normal.     ?   Judgment: Judgment normal.  ? ?CBC ?   ?Component Value Date/Time  ? WBC 9.4 07/05/2021 1549  ? RBC 4.59 07/05/2021 1549  ? HGB 13.1 07/05/2021 1549  ? HCT 42.2 07/05/2021 1549  ? PLT 268 07/05/2021 1549  ? MCV 91.9 07/05/2021 1549  ? MCH 28.5 07/05/2021 1549  ? MCHC 31.0 07/05/2021 1549  ? RDW 14.9 07/05/2021 1549  ? LYMPHSABS 1.2 07/05/2021 1549  ? MONOABS 0.6 07/05/2021 1549  ? EOSABS 0.2 07/05/2021 1549  ? BASOSABS 0.1 07/05/2021 1549  ? ? ?  Latest Ref Rng & Units 07/05/2021  ?  3:49 PM 05/21/2019  ?  2:29 AM 05/20/2019  ?  4:55 AM   ?BMP  ?Glucose 70 - 99 mg/dL 97   121   120    ?BUN 8 - 23 mg/dL 15   18   15     ?Creatinine 0.44 - 1.00 mg/dL 1.04   1.05   0.96    ?Sodium 135 - 145 mmol/L 136   141   138    ?Potassium 3.5 - 5.1 mmol/L 4.4   4.6

## 2021-11-28 ENCOUNTER — Encounter: Payer: Self-pay | Admitting: Pulmonary Disease

## 2021-11-29 ENCOUNTER — Other Ambulatory Visit: Payer: Medicare HMO | Admitting: Pharmacist

## 2021-12-03 ENCOUNTER — Ambulatory Visit (INDEPENDENT_AMBULATORY_CARE_PROVIDER_SITE_OTHER): Payer: Medicare HMO

## 2021-12-03 DIAGNOSIS — J849 Interstitial pulmonary disease, unspecified: Secondary | ICD-10-CM

## 2021-12-03 DIAGNOSIS — R0600 Dyspnea, unspecified: Secondary | ICD-10-CM

## 2021-12-03 LAB — ECHOCARDIOGRAM COMPLETE
AR max vel: 0.94 cm2
AV Area VTI: 1.02 cm2
AV Area mean vel: 0.96 cm2
AV Mean grad: 13 mmHg
AV Peak grad: 24.4 mmHg
Ao pk vel: 2.47 m/s
Area-P 1/2: 2.86 cm2
S' Lateral: 2.4 cm

## 2021-12-04 LAB — HYPERSENSITIVITY PNUEMONITIS PROFILE
ASPERGILLUS FUMIGATUS: NEGATIVE
Faenia retivirgula: NEGATIVE
Pigeon Serum: NEGATIVE
S. VIRIDIS: NEGATIVE
T. CANDIDUS: NEGATIVE
T. VULGARIS: NEGATIVE

## 2021-12-04 LAB — ANTI-SMITH ANTIBODY: ENA SM Ab Ser-aCnc: <1 AI

## 2021-12-04 LAB — ANA: Anti Nuclear Antibody (ANA): POSITIVE — AB

## 2021-12-04 LAB — ANCA SCREEN W REFLEX TITER: ANCA SCREEN: NEGATIVE

## 2021-12-04 LAB — ANTI-NUCLEAR AB-TITER (ANA TITER): ANA Titer 1: 1:40 {titer} — ABNORMAL HIGH

## 2021-12-04 LAB — SJOGRENS SYNDROME-A EXTRACTABLE NUCLEAR ANTIBODY: SSA (Ro) (ENA) Antibody, IgG: <1 AI

## 2021-12-04 LAB — RNP ANTIBODY: Ribonucleic Protein(ENA) Antibody, IgG: <1 AI

## 2021-12-04 LAB — ANTI-SCLERODERMA ANTIBODY: Scleroderma (Scl-70) (ENA) Antibody, IgG: <1 AI

## 2021-12-04 LAB — CYCLIC CITRUL PEPTIDE ANTIBODY, IGG: Cyclic Citrullin Peptide Ab: <16 UNITS

## 2021-12-04 LAB — SJOGRENS SYNDROME-B EXTRACTABLE NUCLEAR ANTIBODY: SSB (La) (ENA) Antibody, IgG: <1 AI

## 2021-12-04 LAB — RHEUMATOID FACTOR: Rheumatoid fact SerPl-aCnc: <14 IU/mL (ref <14)

## 2021-12-05 ENCOUNTER — Telehealth: Payer: Self-pay | Admitting: Pharmacist

## 2021-12-05 ENCOUNTER — Other Ambulatory Visit (HOSPITAL_COMMUNITY): Payer: Self-pay

## 2021-12-05 ENCOUNTER — Ambulatory Visit (INDEPENDENT_AMBULATORY_CARE_PROVIDER_SITE_OTHER): Payer: Medicare HMO | Admitting: Pharmacist

## 2021-12-05 DIAGNOSIS — Z7189 Other specified counseling: Secondary | ICD-10-CM

## 2021-12-05 DIAGNOSIS — Z5181 Encounter for therapeutic drug level monitoring: Secondary | ICD-10-CM

## 2021-12-05 DIAGNOSIS — J849 Interstitial pulmonary disease, unspecified: Secondary | ICD-10-CM | POA: Diagnosis not present

## 2021-12-05 LAB — COMPREHENSIVE METABOLIC PANEL
ALT: 11 U/L (ref 0–35)
AST: 21 U/L (ref 0–37)
Albumin: 4 g/dL (ref 3.5–5.2)
Alkaline Phosphatase: 51 U/L (ref 39–117)
BUN: 17 mg/dL (ref 6–23)
CO2: 28 mEq/L (ref 19–32)
Calcium: 9.2 mg/dL (ref 8.4–10.5)
Chloride: 101 mEq/L (ref 96–112)
Creatinine, Ser: 1.02 mg/dL (ref 0.40–1.20)
GFR: 51.71 mL/min — ABNORMAL LOW (ref >60.00)
Glucose, Bld: 95 mg/dL (ref 70–99)
Potassium: 4.4 mEq/L (ref 3.5–5.1)
Sodium: 137 mEq/L (ref 135–145)
Total Bilirubin: 0.5 mg/dL (ref 0.2–1.2)
Total Protein: 7 g/dL (ref 6.0–8.3)

## 2021-12-05 NOTE — Progress Notes (Signed)
? ?Subjective:  ?Patient presents today to [facility] to see pharmacy team for Erda and Ofev counseling.   Patient was last seen and referred by [facility] on 11/27/21. She was seeing [facility] for second opinion from her primary pulmonology [facility]. She states she has made [facility] her primary pulmonologist and would like to move forward with pirfenidone. Pertinent past medical history includes ILD (UIP pattern on HRCT), GERD, dysphagia, history of PE, colonic polyps ? ?She is naive to antifibrotics. She states she does have occasional cough. She states labs from 11/27/21 have not been discussed with her yet and is interested to know if positive ANA with low titer is significant ? ?History of elevated LFTs: No ?History of diarrhea, nausea, vomiting: No ? ?Objective: ?Allergies  ?Allergen Reactions  ? Oxycodone Shortness Of Breath  ? Simvastatin Other (See Comments)  ?  Leg pain  ? ? ?Outpatient Encounter Medications as of 12/05/2021  ?Medication Sig Note  ? albuterol (VENTOLIN HFA) 108 (90 Base) MCG/ACT inhaler Inhale 2 puffs into the lungs every 4 (four) hours as needed for wheezing or shortness of breath. 05/19/2019: Verified by [facility]- I called  ? ALPRAZolam (XANAX) 0.5 MG tablet Take 0.5 mg by mouth every 8 (eight) hours as needed for anxiety.    ? budesonide (PULMICORT) 0.5 MG/2ML nebulizer solution Take 2 mLs (0.5 mg total) by nebulization 2 (two) times daily.   ? Calcium-Vitamin D 600-125 MG-UNIT TABS Take 1 tablet by mouth 2 (two) times a week.    ? ferrous sulfate 325 (65 FE) MG tablet Take 325 mg by mouth daily with breakfast.   ? ipratropium-albuterol (DUONEB) 0.5-2.5 (3) MG/3ML SOLN Take 3 mLs by nebulization every 6 (six) hours as needed.   ? Multiple Vitamin (MULTIVITAMIN) capsule Take 1 capsule by mouth 2 (two) times a week.    ? omeprazole (PRILOSEC) 40 MG capsule Take 1 capsule (40 mg total) by mouth 2 (two) times daily.   ? PARoxetine (PAXIL) 20 MG tablet Take 20 mg by mouth daily at 3 pm.     ? pravastatin (PRAVACHOL) 80 MG tablet Take 1 tablet (80 mg total) by mouth every evening.   ? predniSONE (DELTASONE) 20 MG tablet Take 40 mg by mouth daily.   ? ?No facility-administered encounter medications on file as of 12/05/2021.  ?  ? ? ?There is no immunization history on file for this patient.  ? ? ?PFT's ?No results found for: FEV1, FVC, FEV1FVC, TLC, DLCO  ? ? ?CMP  ?   ?Component Value Date/Time  ? NA 136 07/05/2021 1549  ? K 4.4 07/05/2021 1549  ? CL 100 07/05/2021 1549  ? CO2 23 07/05/2021 1549  ? GLUCOSE 97 07/05/2021 1549  ? BUN 15 07/05/2021 1549  ? CREATININE 1.04 (H) 07/05/2021 1549  ? CALCIUM 8.9 07/05/2021 1549  ? PROT 6.2 (L) 11/18/2016 1558  ? ALBUMIN 3.3 (L) 05/21/2019 0229  ? AST 33 11/18/2016 1558  ? ALT 25 11/18/2016 1558  ? ALKPHOS 52 11/18/2016 1558  ? BILITOT 0.5 11/18/2016 1558  ? GFRNONAA 54 (L) 07/05/2021 1549  ? GFRAA 59 (L) 05/21/2019 0229  ?  ? ? ?CBC ?   ?Component Value Date/Time  ? WBC 9.4 07/05/2021 1549  ? RBC 4.59 07/05/2021 1549  ? HGB 13.1 07/05/2021 1549  ? HCT 42.2 07/05/2021 1549  ? PLT 268 07/05/2021 1549  ? MCV 91.9 07/05/2021 1549  ? MCH 28.5 07/05/2021 1549  ? MCHC 31.0 07/05/2021 1549  ?  RDW 14.9 07/05/2021 1549  ? LYMPHSABS 1.2 07/05/2021 1549  ? MONOABS 0.6 07/05/2021 1549  ? EOSABS 0.2 07/05/2021 1549  ? BASOSABS 0.1 07/05/2021 1549  ?  ? ? ?LFT's ? ?  Latest Ref Rng & Units 05/21/2019  ?  2:29 AM 11/18/2016  ?  3:58 PM  ?Hepatic Function  ?Total Protein 6.5 - 8.1 g/dL  6.2    ?Albumin 3.5 - 5.0 g/dL 3.3   3.5    ?AST 15 - 41 U/L  33    ?ALT 14 - 54 U/L  25    ?Alk Phosphatase 38 - 126 U/L  52    ?Total Bilirubin 0.3 - 1.2 mg/dL  0.5    ?  ?Her autoimmune labs drawn on 11/27/21 shows low ANA antibody level. All other labs wnl ? ?HRCT (11/23/21) - Mild [place] fibrosis in a pattern with apical to basal gradient, featuring irregular peripheral interstitial opacity, septal thickening, and subpleural bronchiolectasis at the lung bases. Comparison to prior expiratory  and motion limited CT angiograms dating back to 05/19/2019 is difficult however fibrotic ?findings are not grossly changed over this relatively short period of time. Findings are categorized as probable UIP per consensus guidelines ? ?Assessment and Plan ? ?Reviewed that probably UIP on HRCT is indicate of progressive phenotype of ILD. Per chart review, IPF diagnosis has not yet been established. ? ?Thoroughly counseled patient on the efficacy, mechanism of action, dosing, administration, adverse effects, and monitoring parameters of Esbriet. Patient states she'd like to proceed with pirfenidone. Patient verbalized understanding. Patient education handout provided.  ? ?For both Ofev and Esbriet: ? ?Goals of Therapy: Will not stop or reverse the progression of ILD. It will slow the progression of ILD.  ? ?Monitoring: ?Monitor for diarrhea, nausea and vomiting, GI perforation, hepatotoxicity  ?Monitor LFTs - baseline, monthly for first 6 months, then every 3 months routinely ?CBC w differential at baseline and every 3 months routinely ? ?Access: ?Approval will likely have to be through patient assistance since patient has Medicare ? ?Esbriet:  ?Dosing: Starting dose will be Esbriet 267 mg 1 tablet three times daily for 7 days, then 2 tablets three times daily for 7 days, then 3 tablets three times daily.  Maintenance dose will be 801 mg 1 tablet three times daily if tolerated.  Stressed the importance of taking with meals and space at least 5-6 hours apart to minimize stomach upset.  ? ?Adverse Effects: ?Nausea, vomiting, diarrhea, weight loss ?Abdominal pain ?GERD ?Sun sensitivity/rash - patient advised to wear sunscreen when exposed to sunlight ?Dizziness ?Fatigue ? ?Medication Reconciliation ?A drug regimen assessment was performed, including review of allergies, interactions, disease-state management, dosing and immunization history. Medications were reviewed with the patient, including name, instructions,  indication, goals of therapy, potential side effects, importance of adherence, and safe use. ? ?PLAN: ?- Start pirfenidone BIV - [employer] patient assistance application completed by patient today. Provider portion placed in Dr. August Albino box to be signed. ?-  Will send message to [facility] regarding positive ANA with low titer and if rheum referral is needed. ?- CMET drawn today for baseline labs ? ?This appointment required 30 minutes of patient care (this includes precharting, chart review, review of results, face-to-face care, etc.). ? ?Thank you for involving pharmacy to assist in providing this patient's care. ? ?Knox Saliva, PharmD, MPH, BCPS ?Clinical Pharmacist (Rheumatology and Pulmonology)   ?

## 2021-12-05 NOTE — Telephone Encounter (Signed)
Submitted a Prior Authorization request to [facility] for PIRFENIDONE via CoverMyMeds. Will update once we receive a response. ? ?Key: [id number] ? ?Per automated response, authorization already on file for patient. Per test claim, copay for 30 day supply is $884.49. Called [employer] for [place] approval letter to submit with patient assistance application. ? ?Provider form placed in Dr. August Albino mailbox to be signed. ? ?Patient portion with med list and insurance card copy placed in PAP pending info folder in pharmacy office ? ?Knox Saliva, PharmD, MPH, BCPS ?Clinical Pharmacist (Rheumatology and Pulmonology) ? ?

## 2021-12-13 NOTE — Telephone Encounter (Signed)
Submitted Patient Assistance Application to [employer] for ESBRIET along with provider portion, PA and income documents. Will update patient when we receive a response.  Fax# [phone] Phone# [phone] 

## 2021-12-15 LAB — MYOMARKER 3 PLUS PROFILE (RDL)

## 2021-12-18 NOTE — Telephone Encounter (Signed)
Received a fax from [place] regarding an approval for [place] patient assistance from 12/17/2021 until patient no longer qualifies due to discontinuation of therapy, changes to patient's current financial status or health insurance and/or patient no longer meets the program eligibility requirements. ATC pt but had to LVM. Provided update and explained next steps to take if they have not already been contacted by either [employer] or [facility]. Phone numbers to both provided. Requested that pt reach out to [place] here at the clinic with any additional questions or concerns.  Relevant Documents have been sent to scan center. ? ?[employer] Phone#: [phone] option 5 ?[facility] Phone#: [phone] ? ?

## 2021-12-18 NOTE — Telephone Encounter (Signed)
Left VM for patient with [employer] and [facility] phone numbers. Will f/u with her ? ?Knox Saliva, PharmD, MPH, BCPS ?Clinical Pharmacist (Rheumatology and Pulmonology) ?

## 2021-12-31 MED ORDER — PIRFENIDONE 267 MG PO TABS: ORAL_TABLET | ORAL | 0 refills | Status: DC

## 2021-12-31 NOTE — Telephone Encounter (Signed)
ATC patient regarding Esbriet and PAP approval. Phone went straight to VM. Left VM requesting return call with pharmacy team's direct office phone number ? ?Knox Saliva, PharmD, MPH, BCPS, CPP ?Clinical Pharmacist (Rheumatology and Pulmonology) ?

## 2021-12-31 NOTE — Telephone Encounter (Signed)
Patient is returning phone call. Patient phone number is [phone]. ?

## 2021-12-31 NOTE — Telephone Encounter (Addendum)
Spoke with patient regarding Esbriet approval. She states she did not receive any calls from [place] or [employer]. ? ?She was curious how Esbriet would help with her ILD. I reviewed that Esbriet will not help with ongoing symptoms (like cough or SOB) but goal is to slow down progression of fibrosis. He verbalized understanding. She will call Celso [facility] to complete onboarding call ? ?She has been advised to reach back out to clinic's pharmacy team if any issues scheduling Esbriet shipment ? ?Knox Saliva, PharmD, MPH, BCPS, CPP ?Clinical Pharmacist (Rheumatology and Pulmonology) ?

## 2021-12-31 NOTE — Addendum Note (Signed)
Addended by: Cassandria Anger on: 12/31/2021 04:04 PM ? ? Modules accepted: Orders ? ?

## 2022-02-01 DIAGNOSIS — B029 Zoster without complications: Secondary | ICD-10-CM | POA: Diagnosis not present

## 2022-02-01 DIAGNOSIS — K219 Gastro-esophageal reflux disease without esophagitis: Secondary | ICD-10-CM | POA: Diagnosis not present

## 2022-02-04 ENCOUNTER — Telehealth: Payer: Self-pay | Admitting: Pulmonary Disease

## 2022-02-04 DIAGNOSIS — B029 Zoster without complications: Secondary | ICD-10-CM | POA: Diagnosis not present

## 2022-02-04 NOTE — Telephone Encounter (Signed)
Patient states she believes she has shingles which she noticed about a week ago. Patient states it goes from her arms, under her breast around to her back, patient states it is painful and itches, but no blisters on the outside. Patient started Esbriet 3 weeks ago and would like to know if it could have any correlation.   Please advise.

## 2022-02-04 NOTE — Telephone Encounter (Signed)
Lm x1 for patient.  

## 2022-02-05 ENCOUNTER — Telehealth: Payer: Self-pay | Admitting: Pulmonary Disease

## 2022-02-05 NOTE — Telephone Encounter (Signed)
ATC patient x2 LMTCB per protocol will close encounter

## 2022-02-08 NOTE — Telephone Encounter (Signed)
Called patient but call went straight to VM. Left message for her to call back.

## 2022-02-19 ENCOUNTER — Other Ambulatory Visit: Payer: Self-pay | Admitting: Family Medicine

## 2022-02-19 DIAGNOSIS — M5414 Radiculopathy, thoracic region: Secondary | ICD-10-CM

## 2022-02-21 NOTE — Telephone Encounter (Signed)
Message from prior encounter: [facility]   BP   02/04/22  9:08 AM Note Patient states she believes she has shingles which she noticed about a week ago. Patient states it goes from her arms, under her breast around to her back, patient states it is painful and itches, but no blisters on the outside. Patient started Esbriet 3 weeks ago and would like to know if it could have any correlation.    Please advise.      Routing this to both Dr. Erin Fulling and pharmacy team for advice.

## 2022-02-22 NOTE — Telephone Encounter (Signed)
Attempted to call pt but unable to reach. Left message for her to return call. 

## 2022-02-22 NOTE — Telephone Encounter (Signed)
I reviewed literature and I was unable to find any reports of Esbriet placing patients at risk of Shingles and no case reports either. Esbriet is not an immunosuppressive medication, so it does not place patients at risk for infections  Knox Saliva, PharmD, MPH, BCPS, CPP Clinical Pharmacist (Rheumatology and Pulmonology)

## 2022-02-27 ENCOUNTER — Other Ambulatory Visit: Payer: Medicare HMO

## 2022-03-01 ENCOUNTER — Ambulatory Visit
Admission: RE | Admit: 2022-03-01 | Discharge: 2022-03-01 | Disposition: A | Discharge status: Home / Self Care | Payer: Medicare HMO | Source: Ambulatory Visit | Attending: Family Medicine | Admitting: Family Medicine

## 2022-03-01 ENCOUNTER — Other Ambulatory Visit: Payer: Medicare HMO

## 2022-03-01 DIAGNOSIS — M546 Pain in thoracic spine: Secondary | ICD-10-CM | POA: Diagnosis not present

## 2022-03-01 DIAGNOSIS — M5414 Radiculopathy, thoracic region: Secondary | ICD-10-CM

## 2022-03-07 DIAGNOSIS — S22000A Wedge compression fracture of unspecified thoracic vertebra, initial encounter for closed fracture: Secondary | ICD-10-CM | POA: Diagnosis not present

## 2022-03-07 DIAGNOSIS — M810 Age-related osteoporosis without current pathological fracture: Secondary | ICD-10-CM | POA: Diagnosis not present

## 2022-03-07 DIAGNOSIS — F419 Anxiety disorder, unspecified: Secondary | ICD-10-CM | POA: Diagnosis not present

## 2022-03-14 DIAGNOSIS — Z139 Encounter for screening, unspecified: Secondary | ICD-10-CM | POA: Diagnosis not present

## 2022-03-14 DIAGNOSIS — S22000A Wedge compression fracture of unspecified thoracic vertebra, initial encounter for closed fracture: Secondary | ICD-10-CM | POA: Diagnosis not present

## 2022-03-28 ENCOUNTER — Ambulatory Visit: Payer: Medicare HMO | Admitting: Pulmonary Disease

## 2022-03-28 DIAGNOSIS — F419 Anxiety disorder, unspecified: Secondary | ICD-10-CM | POA: Diagnosis not present

## 2022-03-28 DIAGNOSIS — Z79899 Other long term (current) drug therapy: Secondary | ICD-10-CM | POA: Diagnosis not present

## 2022-03-28 DIAGNOSIS — S22000A Wedge compression fracture of unspecified thoracic vertebra, initial encounter for closed fracture: Secondary | ICD-10-CM | POA: Diagnosis not present

## 2022-04-04 DIAGNOSIS — S22080A Wedge compression fracture of T11-T12 vertebra, initial encounter for closed fracture: Secondary | ICD-10-CM | POA: Diagnosis not present

## 2022-04-04 DIAGNOSIS — S32010A Wedge compression fracture of first lumbar vertebra, initial encounter for closed fracture: Secondary | ICD-10-CM | POA: Diagnosis not present

## 2022-04-19 DIAGNOSIS — M8588 Other specified disorders of bone density and structure, other site: Secondary | ICD-10-CM | POA: Diagnosis not present

## 2022-04-19 DIAGNOSIS — M810 Age-related osteoporosis without current pathological fracture: Secondary | ICD-10-CM | POA: Diagnosis not present

## 2022-05-13 DIAGNOSIS — E538 Deficiency of other specified B group vitamins: Secondary | ICD-10-CM | POA: Diagnosis not present

## 2022-05-13 DIAGNOSIS — F419 Anxiety disorder, unspecified: Secondary | ICD-10-CM | POA: Diagnosis not present

## 2022-05-13 DIAGNOSIS — S22000A Wedge compression fracture of unspecified thoracic vertebra, initial encounter for closed fracture: Secondary | ICD-10-CM | POA: Diagnosis not present

## 2022-05-13 DIAGNOSIS — R7303 Prediabetes: Secondary | ICD-10-CM | POA: Diagnosis not present

## 2022-05-13 DIAGNOSIS — E7800 Pure hypercholesterolemia, unspecified: Secondary | ICD-10-CM | POA: Diagnosis not present

## 2022-05-13 DIAGNOSIS — M549 Dorsalgia, unspecified: Secondary | ICD-10-CM | POA: Diagnosis not present

## 2022-05-13 DIAGNOSIS — E559 Vitamin D deficiency, unspecified: Secondary | ICD-10-CM | POA: Diagnosis not present

## 2022-05-13 DIAGNOSIS — K219 Gastro-esophageal reflux disease without esophagitis: Secondary | ICD-10-CM | POA: Diagnosis not present

## 2022-05-13 DIAGNOSIS — N183 Chronic kidney disease, stage 3 unspecified: Secondary | ICD-10-CM | POA: Diagnosis not present

## 2022-05-13 DIAGNOSIS — J849 Interstitial pulmonary disease, unspecified: Secondary | ICD-10-CM | POA: Diagnosis not present

## 2022-05-13 DIAGNOSIS — M810 Age-related osteoporosis without current pathological fracture: Secondary | ICD-10-CM | POA: Diagnosis not present

## 2022-06-13 ENCOUNTER — Telehealth: Payer: Self-pay | Admitting: Pulmonary Disease

## 2022-06-13 DIAGNOSIS — Z5181 Encounter for therapeutic drug level monitoring: Secondary | ICD-10-CM

## 2022-06-13 DIAGNOSIS — J849 Interstitial pulmonary disease, unspecified: Secondary | ICD-10-CM

## 2022-06-13 NOTE — Telephone Encounter (Signed)
Called patient and left a message and told her I would message the pharmacy team about her needs refills on her esbriet medication. I advised her that we were aware that she has 11 days left. I also told her that if she had any questions or concerns to call the office.   Hey can we see about more refills on Esbriet for this patient  Thank you

## 2022-06-13 NOTE — Telephone Encounter (Signed)
Patient is overdue for labwork for monitoring. She started in April 2023 and has not been seen by Dr. Erin Fulling or an APP since starting Esbriet. Additional refills cannot be authorized until labwork is updated at minimum for safety monitoring.  Her f/u appt is on 07/01/22. Can send refills once LFTs are rechecked at that visit  ATC patient but phone went straight to VM. Left DETAILED VM regarding this and requested return call if she'd like for [place] to send labs closer to home  Knox Saliva, PharmD, MPH, BCPS, CPP Clinical Pharmacist (Rheumatology and Pulmonology)

## 2022-06-14 NOTE — Telephone Encounter (Signed)
Spoke with PCP office. She can complete labs there but will need appt. CMET order faxed. Left VM for patient to advise  Phone: [phone] Fax: [phone]  Knox Saliva, PharmD, MPH, BCPS, CPP Clinical Pharmacist (Rheumatology and Pulmonology)

## 2022-06-14 NOTE — Addendum Note (Signed)
Addended by: Cassandria Anger on: 06/14/2022 03:17 PM   Modules accepted: Orders

## 2022-06-18 DIAGNOSIS — Z5181 Encounter for therapeutic drug level monitoring: Secondary | ICD-10-CM | POA: Diagnosis not present

## 2022-06-27 DIAGNOSIS — M546 Pain in thoracic spine: Secondary | ICD-10-CM | POA: Diagnosis not present

## 2022-06-28 MED ORDER — PIRFENIDONE 267 MG PO TABS: 801.0000 mg | ORAL_TABLET | Freq: Three times a day (TID) | ORAL | 1 refills | Status: DC

## 2022-06-28 NOTE — Telephone Encounter (Signed)
Labs received (collected on 06/18/2022) Glucose 109 (H) BUN 17 Creatinine 1.05 (H) GFR 53 (L) AST 16 ALT 8 Alk phs 75 Total bili 0.4  Refill for pirfenidone 833m three times daily sent to [facility] pharmacy. ATC patient - left detailed VM and advised her to reach out to [facility] to schedule Esbriet shipment to her home.  Next appt with Dr. DErin Fullingon 07/01/22  DKnox Saliva PharmD, MPH, BCPS, CPP Clinical Pharmacist (Rheumatology and Pulmonology)

## 2022-07-01 ENCOUNTER — Ambulatory Visit: Payer: Medicare HMO | Admitting: Pulmonary Disease

## 2022-07-01 ENCOUNTER — Encounter: Payer: Self-pay | Admitting: Pulmonary Disease

## 2022-07-01 VITALS — BP 142/66 | HR 69 | Temp 97.8°F | Ht 65.0 in | Wt 251.0 lb

## 2022-07-01 DIAGNOSIS — J849 Interstitial pulmonary disease, unspecified: Secondary | ICD-10-CM

## 2022-07-01 DIAGNOSIS — Z5181 Encounter for therapeutic drug level monitoring: Secondary | ICD-10-CM | POA: Diagnosis not present

## 2022-07-01 NOTE — Patient Instructions (Signed)
We will check a high resolution CT Chest and pulmonary function tests in 6 months  Continue esbriet 3 times daily  We will check lab work today  Follow up in 6 months

## 2022-07-01 NOTE — Progress Notes (Unsigned)
Synopsis: Referred in February 2023 for pulmonary fibrosis by Cyndi Bender, PA  Subjective:   PATIENT ID: Alison York: female DOB: Dec 13, 1940, MRN: [id number]  HPI  Chief Complaint  Patient presents with   Follow-up    Follow-Up  PT states breathing is good since last visit   Alison York is an 81 year old woman, former smoker with CKD stage III, pulmonary embolus, GERD and asthma who returns to pulmonary clinic for evaluation of pulmonary fibrosis.  She was started on esbriet 11/2021. She is taking 3 tablets 3 times per day. She will be getting 89m tablets this Friday. No GI issues. No skin sensitivity.   She is using the albuterol/ipratropium and budesonide nebulizer treatments as needed.   She is having issues of pain on her left side due to a fracture in a vertebrae. She is taking gabapentin for nerve pain. She could not tolerate narcotics.   OV 11/27/21 [facility]/FVC 81, FVC 2.19L (82%), FEV1 1.77L (87%), TLC 67% and DLCO 56%  HRCT Chest 11/23/21 shows mild pulmonary fibrosis pattern, categorized as probable UIP. Mild tracheobronchomalacia and large hiatal hernia.   She reports her GERD is better with elevating the head of her bed.   She hasn't required nebulizer treatments since completing course of prednisone. Feels her breathing is stable at this time.   OV 10/18/21 She has [facility] who has diagnosed her with pulmonary fibrosis and has recommended pirfenidone therapy.  She wanted a second opinion and has been referred to our clinic.  She is currently taking a steroid taper which has significantly helped her breathing as it has eliminated cough and wheezing and reduced her shortness of breath.  She is currently using as needed albuterol inhaler or nebulizer treatments which provide some relief.  After finishing steroid tapers in the past her cough wheezing and shortness of breath have  returned.  She has significant history of GERD and a moderate-sized hiatal hernia on chest imaging.  She had EGD in 2022 which was unremarkable except for the hiatal hernia.  She is taking omeprazole 40 mg daily.  She denies any seasonal allergies and she denies any sinus congestion or postnasal drainage.  She has about a 20-pack-year smoking history and quit at age 81  She previously worked at [employer] significant dust exposure.  No family history of lung disease.  Her son has multiple myeloma and her husband has recently been diagnosed with dementia.  Past Medical History:  Diagnosis Date   Anemia    Anxiety state, unspecified    Cataract    [place]   Dysphagia    Erosive esophagitis    Esophageal reflux    Esophageal stricture    Hiatal hernia    Hypercholesterolemia    Hypertension    Internal hemorrhoids    Osteoporosis    Personal history of colonic polyps 1999 & 11/04/2011   villous adenoma & tubular adenoma   Prediabetes    Pulmonary embolism ([place]   Restless legs syndrome (RLS)    Shortness of breath    Vitamin D deficiency      Family History  Problem Relation Age of Onset   Hypertension Father    Stroke Father        ministrokes start in 468's  Tuberculosis Mother    CAD Mother    Breast cancer Sister    Heart failure Sister  possible CAD   Diabetes Sister    Mental illness Sister    Alzheimer's disease Sister    Breast cancer Daughter    Kidney cancer Brother    Colon cancer Neg Hx    Colon polyps Neg Hx    Esophageal cancer Neg Hx    Rectal cancer Neg Hx    Stomach cancer Neg Hx      Social History   Socioeconomic History   Marital status: Married    Spouse name: Not on file   Number of children: 2   Years of education: Not on file   Highest education level: Not on file  Occupational History   Occupation: retired works part time at [employer]  Tobacco Use   Smoking status: Former    Types:  Cigarettes    Quit date: 08/26/1972    Years since quitting: 49.8   Smokeless tobacco: Never  Vaping Use   Vaping Use: Never used  Substance and Sexual Activity   Alcohol use: No   Drug use: No   Sexual activity: Not on file  Other Topics Concern   Not on file  Social History Narrative   Not on file   Social Determinants of Health   Financial Resource Strain: Not on file  Food Insecurity: Not on file  Transportation Needs: Not on file  Physical Activity: Not on file  Stress: Not on file  Social Connections: Not on file  Intimate Partner Violence: Not on file     Allergies  Allergen Reactions   Oxycodone Shortness Of Breath   Simvastatin Other (See Comments)    Leg pain     Outpatient Medications Prior to Visit  Medication Sig Dispense Refill   albuterol (VENTOLIN HFA) 108 (90 Base) MCG/ACT inhaler Inhale 2 puffs into the lungs every 4 (four) hours as needed for wheezing or shortness of breath.     ALPRAZolam (XANAX) 0.5 MG tablet Take 0.5 mg by mouth every 8 (eight) hours as needed for anxiety.      budesonide (PULMICORT) 0.5 MG/2ML nebulizer solution Take 2 mLs (0.5 mg total) by nebulization 2 (two) times daily. 120 mL 11   Calcium-Vitamin D 600-125 MG-UNIT TABS Take 1 tablet by mouth 2 (two) times a week.      ferrous sulfate 325 (65 FE) MG tablet Take 325 mg by mouth daily with breakfast.     gabapentin (NEURONTIN) 600 MG tablet Take 600 mg by mouth 2 (two) times daily.     ipratropium-albuterol (DUONEB) 0.5-2.5 (3) MG/3ML SOLN Take 3 mLs by nebulization every 6 (six) hours as needed. 360 mL 0   Multiple Vitamin (MULTIVITAMIN) capsule Take 1 capsule by mouth 2 (two) times a week.      omeprazole (PRILOSEC) 40 MG capsule Take 1 capsule (40 mg total) by mouth 2 (two) times daily. 60 capsule 3   PARoxetine (PAXIL) 20 MG tablet Take 20 mg by mouth daily at 3 pm.      Pirfenidone (ESBRIET) 267 MG TABS Take 3 tablets (801 mg total) by mouth 3 (three) times daily with meals.  810 tablet 1   predniSONE (DELTASONE) 20 MG tablet Take 40 mg by mouth daily.     pravastatin (PRAVACHOL) 80 MG tablet Take 1 tablet (80 mg total) by mouth every evening. 90 tablet 3   No facility-administered medications prior to visit.   Review of Systems  Constitutional:  Negative for chills, fever, malaise/fatigue and weight loss.  HENT:  Negative for congestion, sinus  pain and sore throat.   Eyes: Negative.   Respiratory:  Positive for cough and shortness of breath. Negative for hemoptysis, sputum production and wheezing.   Cardiovascular:  Negative for chest pain, palpitations, orthopnea, claudication and leg swelling.  Gastrointestinal:  Positive for heartburn. Negative for abdominal pain, nausea and vomiting.  Genitourinary: Negative.   Musculoskeletal:  Negative for joint pain and myalgias.  Skin:  Negative for rash.  Neurological:  Negative for weakness.  Endo/Heme/Allergies: Negative.   Psychiatric/Behavioral: Negative.      Objective:   Vitals:   07/01/22 1534  BP: (!) 142/66  Pulse: 69  Temp: 97.8 F (36.6 C)  TempSrc: Oral  SpO2: 96%  Weight: 251 lb (113.9 kg)  Height: _0  (1.651 m)   Physical Exam Constitutional:      General: She is not in acute distress.    Appearance: She is obese. She is not ill-appearing.  HENT:     Head: Normocephalic and atraumatic.  Eyes:     General: No scleral icterus.    Conjunctiva/sclera: Conjunctivae normal.  Cardiovascular:     Rate and Rhythm: Normal rate and regular rhythm.     Pulses: Normal pulses.     Heart sounds: Normal heart sounds. No murmur heard. Pulmonary:     Effort: Pulmonary effort is normal.     Breath sounds: Rales (bibasilar) present. No wheezing or rhonchi.  Musculoskeletal:     Right lower leg: No edema.     Left lower leg: No edema.  Skin:    General: Skin is warm and dry.  Neurological:     General: No focal deficit present.     Mental Status: She is alert.  Psychiatric:        Mood and  Affect: Mood normal.        Behavior: Behavior normal.        Thought Content: Thought content normal.        Judgment: Judgment normal.    CBC    Component Value Date/Time   WBC 9.4 07/05/2021 1549   RBC 4.59 07/05/2021 1549   HGB 13.1 07/05/2021 1549   HCT 42.2 07/05/2021 1549   PLT 268 07/05/2021 1549   MCV 91.9 07/05/2021 1549   MCH 28.5 07/05/2021 1549   MCHC 31.0 07/05/2021 1549   RDW 14.9 07/05/2021 1549   LYMPHSABS 1.2 07/05/2021 1549   MONOABS 0.6 07/05/2021 1549   EOSABS 0.2 07/05/2021 1549   BASOSABS 0.1 07/05/2021 1549      Latest Ref Rng & Units 12/05/2021    1:40 PM 07/05/2021    3:49 PM 05/21/2019    2:29 AM  BMP  Glucose 70 - 99 mg/dL 95  97  121   BUN 6 - 23 mg/dL _1 Creatinine 0.40 - 1.20 mg/dL 1.02  1.04  1.05   Sodium 135 - 145 mEq/L 137  136  141   Potassium 3.5 - 5.1 mEq/L 4.4  4.4  4.6   Chloride 96 - 112 mEq/L 101  100  106   CO2 19 - 32 mEq/L _2 Calcium 8.4 - 10.5 mg/dL 9.2  8.9  9.0    Chest imaging: HRCT Chest 11/23/21 1. Mild pulmonary fibrosis in a pattern with apical to basal gradient, featuring irregular peripheral interstitial opacity, septal thickening, and subpleural bronchiolectasis at the lung bases. Comparison to prior expiratory and motion limited CT angiograms dating back to 05/19/2019 is difficult however  fibrotic findings are not grossly changed over this relatively short period of time. Findings are categorized as probable UIP per consensus guidelines: Diagnosis of Idiopathic Pulmonary Fibrosis: An Official ATS/ERS/JRS/ALAT Clinical Practice Guideline. [place], Iss 5, [id number], Apr 26 2017. 2. Suspect mild tracheobronchomalacia on expiratory phase imaging. 3. Large hiatal hernia. 4. Cardiomegaly.  CTA Chest 07/06/21 Cardiovascular: The heart is enlarged and there is no pericardial effusion. A few scattered coronary artery calcifications are noted. There is atherosclerotic  calcification of the aorta without evidence of aneurysm. The pulmonary trunk is normal in caliber. No large central pulmonary artery filling defect. Evaluation of the segmental and subsegmental arteries is limited due to mixing artifact and respiratory motion.   Mediastinum/Nodes: Shotty lymph nodes are present in the mediastinum. There are nonspecific prominent hilar lymph nodes bilaterally. No axillary lymphadenopathy is seen. The thyroid gland, trachea, and esophagus are within normal limits. There is a moderate to large hiatal hernia.   Lungs/Pleura: Subpleural fibrotic changes and atelectasis are noted bilaterally. No effusion or pneumothorax is seen.   Upper Abdomen: There is a moderate to large hiatal hernia.  PFT:     No data to display         Labs:  Path:  Echo 2019: LV EF 65-70%. Grade I diastolic dysfunction. RV size and systolic normal.   Heart Catheterization:  Assessment & Plan:   ILD (interstitial lung disease) ([place])  Medication monitoring encounter  Discussion: Nitika Jackowski is an 81 year old woman, former smoker with CKD stage III, pulmonary embolus, GERD and asthma who returns to pulmonary clinic for pulmonary fibrosis.  She is to continue esbriet 813m three times daily. Check CMP today.  She is to use budesonide and duoneb treatments as needed for her reactive airways disease.   For GERD, she is to continue PPI therapy along with sleeping with the head of her bed elevated.    Follow-up in 6 months with HRCT Chest and PFTs.  JFreda Jackson MD [facility] Office: [phone]  Current Outpatient Medications:    albuterol (VENTOLIN HFA) 108 (90 Base) MCG/ACT inhaler, Inhale 2 puffs into the lungs every 4 (four) hours as needed for wheezing or shortness of breath., Disp: , Rfl:    ALPRAZolam (XANAX) 0.5 MG tablet, Take 0.5 mg by mouth every 8 (eight) hours as needed for anxiety. , Disp: , Rfl:    budesonide (PULMICORT)  0.5 MG/2ML nebulizer solution, Take 2 mLs (0.5 mg total) by nebulization 2 (two) times daily., Disp: 120 mL, Rfl: 11   Calcium-Vitamin D 600-125 MG-UNIT TABS, Take 1 tablet by mouth 2 (two) times a week. , Disp: , Rfl:    ferrous sulfate 325 (65 FE) MG tablet, Take 325 mg by mouth daily with breakfast., Disp: , Rfl:    gabapentin (NEURONTIN) 600 MG tablet, Take 600 mg by mouth 2 (two) times daily., Disp: , Rfl:    ipratropium-albuterol (DUONEB) 0.5-2.5 (3) MG/3ML SOLN, Take 3 mLs by nebulization every 6 (six) hours as needed., Disp: 360 mL, Rfl: 0   Multiple Vitamin (MULTIVITAMIN) capsule, Take 1 capsule by mouth 2 (two) times a week. , Disp: , Rfl:    omeprazole (PRILOSEC) 40 MG capsule, Take 1 capsule (40 mg total) by mouth 2 (two) times daily., Disp: 60 capsule, Rfl: 3   PARoxetine (PAXIL) 20 MG tablet, Take 20 mg by mouth daily at 3 pm. , Disp: , Rfl:    Pirfenidone (ESBRIET) 267  MG TABS, Take 3 tablets (801 mg total) by mouth 3 (three) times daily with meals., Disp: 810 tablet, Rfl: 1   predniSONE (DELTASONE) 20 MG tablet, Take 40 mg by mouth daily., Disp: , Rfl:    pravastatin (PRAVACHOL) 80 MG tablet, Take 1 tablet (80 mg total) by mouth every evening., Disp: 90 tablet, Rfl: 3

## 2022-07-02 DIAGNOSIS — J849 Interstitial pulmonary disease, unspecified: Secondary | ICD-10-CM | POA: Diagnosis not present

## 2022-07-02 DIAGNOSIS — R079 Chest pain, unspecified: Secondary | ICD-10-CM | POA: Diagnosis not present

## 2022-07-02 DIAGNOSIS — N183 Chronic kidney disease, stage 3 unspecified: Secondary | ICD-10-CM | POA: Diagnosis not present

## 2022-07-02 DIAGNOSIS — I4510 Unspecified right bundle-branch block: Secondary | ICD-10-CM | POA: Diagnosis not present

## 2022-07-02 DIAGNOSIS — M5414 Radiculopathy, thoracic region: Secondary | ICD-10-CM | POA: Diagnosis not present

## 2022-07-02 DIAGNOSIS — M810 Age-related osteoporosis without current pathological fracture: Secondary | ICD-10-CM | POA: Diagnosis not present

## 2022-07-02 LAB — COMPREHENSIVE METABOLIC PANEL
ALT: 6 U/L (ref 0–35)
AST: 16 U/L (ref 0–37)
Albumin: 4.2 g/dL (ref 3.5–5.2)
Alkaline Phosphatase: 65 U/L (ref 39–117)
BUN: 19 mg/dL (ref 6–23)
CO2: 28 mEq/L (ref 19–32)
Calcium: 9.5 mg/dL (ref 8.4–10.5)
Chloride: 102 mEq/L (ref 96–112)
Creatinine, Ser: 1.12 mg/dL (ref 0.40–1.20)
GFR: 46.04 mL/min — ABNORMAL LOW (ref >60.00)
Glucose, Bld: 89 mg/dL (ref 70–99)
Potassium: 4.5 mEq/L (ref 3.5–5.1)
Sodium: 138 mEq/L (ref 135–145)
Total Bilirubin: 0.4 mg/dL (ref 0.2–1.2)
Total Protein: 7.4 g/dL (ref 6.0–8.3)

## 2022-07-03 ENCOUNTER — Encounter: Payer: Self-pay | Admitting: Pulmonary Disease

## 2022-07-24 DIAGNOSIS — L989 Disorder of the skin and subcutaneous tissue, unspecified: Secondary | ICD-10-CM | POA: Diagnosis not present

## 2022-08-16 DIAGNOSIS — U071 COVID-19: Secondary | ICD-10-CM | POA: Diagnosis not present

## 2022-08-16 DIAGNOSIS — R6889 Other general symptoms and signs: Secondary | ICD-10-CM | POA: Diagnosis not present

## 2022-08-27 DIAGNOSIS — U071 COVID-19: Secondary | ICD-10-CM | POA: Diagnosis not present

## 2022-08-27 DIAGNOSIS — Z885 Allergy status to narcotic agent status: Secondary | ICD-10-CM | POA: Diagnosis not present

## 2022-08-27 DIAGNOSIS — E785 Hyperlipidemia, unspecified: Secondary | ICD-10-CM | POA: Diagnosis not present

## 2022-08-27 DIAGNOSIS — J441 Chronic obstructive pulmonary disease with (acute) exacerbation: Secondary | ICD-10-CM | POA: Diagnosis not present

## 2022-08-27 DIAGNOSIS — I10 Essential (primary) hypertension: Secondary | ICD-10-CM | POA: Diagnosis not present

## 2022-08-27 DIAGNOSIS — K219 Gastro-esophageal reflux disease without esophagitis: Secondary | ICD-10-CM | POA: Diagnosis not present

## 2022-08-27 DIAGNOSIS — Z87891 Personal history of nicotine dependence: Secondary | ICD-10-CM | POA: Diagnosis not present

## 2022-08-27 DIAGNOSIS — R0600 Dyspnea, unspecified: Secondary | ICD-10-CM | POA: Diagnosis not present

## 2022-08-27 DIAGNOSIS — Z79899 Other long term (current) drug therapy: Secondary | ICD-10-CM | POA: Diagnosis not present

## 2022-08-27 DIAGNOSIS — K449 Diaphragmatic hernia without obstruction or gangrene: Secondary | ICD-10-CM | POA: Diagnosis not present

## 2022-08-27 DIAGNOSIS — F419 Anxiety disorder, unspecified: Secondary | ICD-10-CM | POA: Diagnosis not present

## 2022-08-27 DIAGNOSIS — Z888 Allergy status to other drugs, medicaments and biological substances status: Secondary | ICD-10-CM | POA: Diagnosis not present

## 2022-08-30 DIAGNOSIS — Z87891 Personal history of nicotine dependence: Secondary | ICD-10-CM | POA: Diagnosis not present

## 2022-08-30 DIAGNOSIS — M6281 Muscle weakness (generalized): Secondary | ICD-10-CM | POA: Diagnosis not present

## 2022-08-30 DIAGNOSIS — J84112 Idiopathic pulmonary fibrosis: Secondary | ICD-10-CM | POA: Diagnosis not present

## 2022-08-30 DIAGNOSIS — B974 Respiratory syncytial virus as the cause of diseases classified elsewhere: Secondary | ICD-10-CM | POA: Diagnosis not present

## 2022-08-30 DIAGNOSIS — E785 Hyperlipidemia, unspecified: Secondary | ICD-10-CM | POA: Diagnosis not present

## 2022-08-30 DIAGNOSIS — K219 Gastro-esophageal reflux disease without esophagitis: Secondary | ICD-10-CM | POA: Diagnosis not present

## 2022-08-30 DIAGNOSIS — J441 Chronic obstructive pulmonary disease with (acute) exacerbation: Secondary | ICD-10-CM | POA: Diagnosis not present

## 2022-08-30 DIAGNOSIS — Z6841 Body Mass Index (BMI) 40.0 and over, adult: Secondary | ICD-10-CM | POA: Diagnosis not present

## 2022-08-30 DIAGNOSIS — R0600 Dyspnea, unspecified: Secondary | ICD-10-CM | POA: Diagnosis not present

## 2022-08-30 DIAGNOSIS — F419 Anxiety disorder, unspecified: Secondary | ICD-10-CM | POA: Diagnosis not present

## 2022-08-30 DIAGNOSIS — Z20822 Contact with and (suspected) exposure to covid-19: Secondary | ICD-10-CM | POA: Diagnosis not present

## 2022-08-30 DIAGNOSIS — J9601 Acute respiratory failure with hypoxia: Secondary | ICD-10-CM | POA: Diagnosis not present

## 2022-08-30 DIAGNOSIS — M1990 Unspecified osteoarthritis, unspecified site: Secondary | ICD-10-CM | POA: Diagnosis not present

## 2022-08-30 DIAGNOSIS — E7849 Other hyperlipidemia: Secondary | ICD-10-CM | POA: Diagnosis not present

## 2022-08-31 DIAGNOSIS — J84112 Idiopathic pulmonary fibrosis: Secondary | ICD-10-CM | POA: Diagnosis not present

## 2022-09-01 DIAGNOSIS — E46 Unspecified protein-calorie malnutrition: Secondary | ICD-10-CM | POA: Diagnosis not present

## 2022-09-01 DIAGNOSIS — J84112 Idiopathic pulmonary fibrosis: Secondary | ICD-10-CM | POA: Diagnosis not present

## 2022-09-02 DIAGNOSIS — E7849 Other hyperlipidemia: Secondary | ICD-10-CM | POA: Diagnosis not present

## 2022-09-02 DIAGNOSIS — K219 Gastro-esophageal reflux disease without esophagitis: Secondary | ICD-10-CM | POA: Diagnosis not present

## 2022-09-02 DIAGNOSIS — J84112 Idiopathic pulmonary fibrosis: Secondary | ICD-10-CM | POA: Diagnosis not present

## 2022-09-02 DIAGNOSIS — F419 Anxiety disorder, unspecified: Secondary | ICD-10-CM | POA: Diagnosis not present

## 2022-09-11 DIAGNOSIS — J9601 Acute respiratory failure with hypoxia: Secondary | ICD-10-CM | POA: Diagnosis not present

## 2022-09-11 DIAGNOSIS — S22000A Wedge compression fracture of unspecified thoracic vertebra, initial encounter for closed fracture: Secondary | ICD-10-CM | POA: Diagnosis not present

## 2022-09-11 DIAGNOSIS — Z1331 Encounter for screening for depression: Secondary | ICD-10-CM | POA: Diagnosis not present

## 2022-09-11 DIAGNOSIS — J9610 Chronic respiratory failure, unspecified whether with hypoxia or hypercapnia: Secondary | ICD-10-CM | POA: Diagnosis not present

## 2022-09-11 DIAGNOSIS — B338 Other specified viral diseases: Secondary | ICD-10-CM | POA: Diagnosis not present

## 2022-09-11 DIAGNOSIS — J849 Interstitial pulmonary disease, unspecified: Secondary | ICD-10-CM | POA: Diagnosis not present

## 2022-09-19 DIAGNOSIS — J8410 Pulmonary fibrosis, unspecified: Secondary | ICD-10-CM | POA: Diagnosis not present

## 2022-09-19 DIAGNOSIS — Z9181 History of falling: Secondary | ICD-10-CM | POA: Diagnosis not present

## 2022-09-19 DIAGNOSIS — Z7409 Other reduced mobility: Secondary | ICD-10-CM | POA: Diagnosis not present

## 2022-09-19 DIAGNOSIS — R2689 Other abnormalities of gait and mobility: Secondary | ICD-10-CM | POA: Diagnosis not present

## 2022-09-19 DIAGNOSIS — R531 Weakness: Secondary | ICD-10-CM | POA: Diagnosis not present

## 2022-09-19 DIAGNOSIS — R5383 Other fatigue: Secondary | ICD-10-CM | POA: Diagnosis not present

## 2022-09-19 DIAGNOSIS — J84112 Idiopathic pulmonary fibrosis: Secondary | ICD-10-CM | POA: Diagnosis not present

## 2022-09-19 DIAGNOSIS — R0602 Shortness of breath: Secondary | ICD-10-CM | POA: Diagnosis not present

## 2022-09-25 DIAGNOSIS — J84112 Idiopathic pulmonary fibrosis: Secondary | ICD-10-CM | POA: Diagnosis not present

## 2022-09-25 DIAGNOSIS — K219 Gastro-esophageal reflux disease without esophagitis: Secondary | ICD-10-CM | POA: Diagnosis not present

## 2022-09-25 DIAGNOSIS — R42 Dizziness and giddiness: Secondary | ICD-10-CM | POA: Diagnosis not present

## 2022-09-25 DIAGNOSIS — E785 Hyperlipidemia, unspecified: Secondary | ICD-10-CM | POA: Diagnosis not present

## 2022-10-08 ENCOUNTER — Encounter: Payer: Self-pay | Admitting: Pulmonary Disease

## 2022-10-08 ENCOUNTER — Ambulatory Visit: Payer: Medicare HMO | Admitting: Pulmonary Disease

## 2022-10-08 VITALS — BP 128/84 | HR 76 | Ht 65.0 in | Wt 240.6 lb

## 2022-10-08 DIAGNOSIS — J849 Interstitial pulmonary disease, unspecified: Secondary | ICD-10-CM | POA: Diagnosis not present

## 2022-10-08 DIAGNOSIS — J4530 Mild persistent asthma, uncomplicated: Secondary | ICD-10-CM

## 2022-10-08 NOTE — Progress Notes (Signed)
Synopsis: Referred in February 2023 for pulmonary fibrosis by Cyndi Bender, PA  Subjective:   PATIENT ID: Alison York DOB: May 18, 1941, MRN: [id number]  HPI  Chief Complaint  Patient presents with   Hospitalization Follow-up    Was in the hospital for [place] and RSV last month. States she is feeling much better.    Alison York is an 82 year old woman, former smoker with CKD stage III, pulmonary embolus, GERD and asthma who returns to pulmonary clinic for evaluation of pulmonary fibrosis.  She was admitted 08/30/2022 for Covid and RSV infection leading to acute exacerbation in her pulmonary fibrosis. She was treated with extended steroid taper and supplemental oxygen.   She is not experiencing wheezing. She has intermittent cough and mucous production. She complains of shortness of breath when she is just waking up. She recently stopped going to pulmonary rehab at [facility] in [place].  OV 07/01/22 She was started on esbriet 11/2021. She is taking 3 tablets 3 times per day. She will be getting 860m tablets this Friday. No GI issues. No skin sensitivity.   She is using the albuterol/ipratropium and budesonide nebulizer treatments as needed.   She is having issues of pain on her left side due to a fracture in a vertebrae. She is taking gabapentin for nerve pain. She could not tolerate narcotics.   OV 11/27/21 [facility]/FVC 81, FVC 2.19L (82%), FEV1 1.77L (87%), TLC 67% and DLCO 56%  HRCT Chest 11/23/21 shows mild pulmonary fibrosis pattern, categorized as probable UIP. Mild tracheobronchomalacia and large hiatal hernia.   She reports her GERD is better with elevating the head of her bed.   She hasn't required nebulizer treatments since completing course of prednisone. Feels her breathing is stable at this time.   OV 10/18/21 She has [facility] who has diagnosed her with pulmonary fibrosis and has  recommended pirfenidone therapy.  She wanted a second opinion and has been referred to our clinic.  She is currently taking a steroid taper which has significantly helped her breathing as it has eliminated cough and wheezing and reduced her shortness of breath.  She is currently using as needed albuterol inhaler or nebulizer treatments which provide some relief.  After finishing steroid tapers in the past her cough wheezing and shortness of breath have returned.  She has significant history of GERD and a moderate-sized hiatal hernia on chest imaging.  She had EGD in 2022 which was unremarkable except for the hiatal hernia.  She is taking omeprazole 40 mg daily.  She denies any seasonal allergies and she denies any sinus congestion or postnasal drainage.  She has about a 20-pack-year smoking history and quit at age 89  She previously worked at [employer] significant dust exposure.  No family history of lung disease.  Her son has multiple myeloma and her husband has recently been diagnosed with dementia.  Past Medical History:  Diagnosis Date   Anemia    Anxiety state, unspecified    Cataract    Cerebral hemorrhage ([place] 1995   Dysphagia    Erosive esophagitis    Esophageal reflux    Esophageal stricture    Hiatal hernia    Hypercholesterolemia    Hypertension    Internal hemorrhoids    Osteoporosis    Personal history of colonic polyps 1999 & 11/04/2011   villous adenoma & tubular adenoma   Prediabetes  Pulmonary embolism ([place]) 2020   Restless legs syndrome (RLS)    Shortness of breath    Vitamin D deficiency      Family History  Problem Relation Age of Onset   Hypertension Father    Stroke Father        ministrokes start in 34's   Tuberculosis Mother    CAD Mother    Breast cancer Sister    Heart failure Sister        possible CAD   Diabetes Sister    Mental illness Sister    Alzheimer's disease Sister    Breast cancer Daughter    Kidney cancer Brother    Colon  cancer Neg Hx    Colon polyps Neg Hx    Esophageal cancer Neg Hx    Rectal cancer Neg Hx    Stomach cancer Neg Hx      Social History   Socioeconomic History   Marital status: Married    Spouse name: Not on file   Number of children: 2   Years of education: Not on file   Highest education level: Not on file  Occupational History   Occupation: retired works part time at [employer]  Tobacco Use   Smoking status: Former    Types: Cigarettes    Quit date: 08/26/1972    Years since quitting: 50.1   Smokeless tobacco: Never  Vaping Use   Vaping Use: Never used  Substance and Sexual Activity   Alcohol use: No   Drug use: No   Sexual activity: Not on file  Other Topics Concern   Not on file  Social History Narrative   Not on file   Social Determinants of Health   Financial Resource Strain: Not on file  Food Insecurity: Not on file  Transportation Needs: Not on file  Physical Activity: Not on file  Stress: Not on file  Social Connections: Not on file  Intimate Partner Violence: Not on file     Allergies  Allergen Reactions   Oxycodone Shortness Of Breath   Simvastatin Other (See Comments)    Leg pain     Outpatient Medications Prior to Visit  Medication Sig Dispense Refill   albuterol (VENTOLIN HFA) 108 (90 Base) MCG/ACT inhaler Inhale 2 puffs into the lungs every 4 (four) hours as needed for wheezing or shortness of breath.     ALPRAZolam (XANAX) 0.5 MG tablet Take 0.5 mg by mouth every 8 (eight) hours as needed for anxiety.      budesonide (PULMICORT) 0.5 MG/2ML nebulizer solution Take 2 mLs (0.5 mg total) by nebulization 2 (two) times daily. 120 mL 11   Calcium-Vitamin D 600-125 MG-UNIT TABS Take 1 tablet by mouth 2 (two) times a week.      ferrous sulfate 325 (65 FE) MG tablet Take 325 mg by mouth daily with breakfast.     gabapentin (NEURONTIN) 600 MG tablet Take 600 mg by mouth 2 (two) times daily.     ipratropium-albuterol (DUONEB) 0.5-2.5 (3) MG/3ML SOLN  Take 3 mLs by nebulization every 6 (six) hours as needed. 360 mL 0   Multiple Vitamin (MULTIVITAMIN) capsule Take 1 capsule by mouth 2 (two) times a week.      omeprazole (PRILOSEC) 40 MG capsule Take 1 capsule (40 mg total) by mouth 2 (two) times daily. 60 capsule 3   PARoxetine (PAXIL) 20 MG tablet Take 20 mg by mouth daily at 3 pm.      Pirfenidone (ESBRIET) 267 MG TABS  Take 3 tablets (801 mg total) by mouth 3 (three) times daily with meals. 810 tablet 1   predniSONE (DELTASONE) 20 MG tablet Take 40 mg by mouth daily.     pravastatin (PRAVACHOL) 80 MG tablet Take 1 tablet (80 mg total) by mouth every evening. 90 tablet 3   No facility-administered medications prior to visit.   Review of Systems  Constitutional:  Negative for chills, fever, malaise/fatigue and weight loss.  HENT:  Negative for congestion, sinus pain and sore throat.   Eyes: Negative.   Respiratory:  Positive for cough and shortness of breath. Negative for hemoptysis, sputum production and wheezing.   Cardiovascular:  Negative for chest pain, palpitations, orthopnea, claudication and leg swelling.  Gastrointestinal:  Positive for heartburn. Negative for abdominal pain, nausea and vomiting.  Genitourinary: Negative.   Musculoskeletal:  Negative for joint pain and myalgias.  Skin:  Negative for rash.  Neurological:  Negative for weakness.  Endo/Heme/Allergies: Negative.   Psychiatric/Behavioral: Negative.     Objective:   Vitals:   10/08/22 1409  BP: 128/84  Pulse: 76  SpO2: 99%  Weight: 240 lb 9.6 oz (109.1 kg)  Height: 5' 5"$  (1.651 m)   Physical Exam Constitutional:      General: She is not in acute distress.    Appearance: She is obese. She is not ill-appearing.  HENT:     Head: Normocephalic and atraumatic.  Eyes:     General: No scleral icterus.    Conjunctiva/sclera: Conjunctivae normal.  Cardiovascular:     Rate and Rhythm: Normal rate and regular rhythm.     Pulses: Normal pulses.     Heart  sounds: Normal heart sounds. No murmur heard. Pulmonary:     Effort: Pulmonary effort is normal.     Breath sounds: Rales (bibasilar) present. No wheezing or rhonchi.  Musculoskeletal:     Right lower leg: No edema.     Left lower leg: No edema.  Skin:    General: Skin is warm and dry.  Neurological:     General: No focal deficit present.     Mental Status: She is alert.    CBC    Component Value Date/Time   WBC 9.4 07/05/2021 1549   RBC 4.59 07/05/2021 1549   HGB 13.1 07/05/2021 1549   HCT 42.2 07/05/2021 1549   PLT 268 07/05/2021 1549   MCV 91.9 07/05/2021 1549   MCH 28.5 07/05/2021 1549   MCHC 31.0 07/05/2021 1549   RDW 14.9 07/05/2021 1549   LYMPHSABS 1.2 07/05/2021 1549   MONOABS 0.6 07/05/2021 1549   EOSABS 0.2 07/05/2021 1549   BASOSABS 0.1 07/05/2021 1549      Latest Ref Rng & Units 07/01/2022    4:27 PM 12/05/2021    1:40 PM 07/05/2021    3:49 PM  BMP  Glucose 70 - 99 mg/dL 89  95  97   BUN 6 - 23 mg/dL 19  17  15   $ Creatinine 0.40 - 1.20 mg/dL 1.12  1.02  1.04   Sodium 135 - 145 mEq/L 138  137  136   Potassium 3.5 - 5.1 mEq/L 4.5  4.4  4.4   Chloride 96 - 112 mEq/L 102  101  100   CO2 19 - 32 mEq/L 28  28  23   $ Calcium 8.4 - 10.5 mg/dL 9.5  9.2  8.9    Chest imaging: HRCT Chest 11/23/21 1. Mild pulmonary fibrosis in a pattern with apical to basal gradient, featuring irregular  peripheral interstitial opacity, septal thickening, and subpleural bronchiolectasis at the lung bases. Comparison to prior expiratory and motion limited CT angiograms dating back to 05/19/2019 is difficult however fibrotic findings are not grossly changed over this relatively short period of time. Findings are categorized as probable UIP per consensus guidelines: Diagnosis of Idiopathic Pulmonary Fibrosis: An Official ATS/ERS/JRS/ALAT Clinical Practice Guideline. Early, Iss 5, [id number], Apr 26 2017. 2. Suspect mild tracheobronchomalacia on expiratory  phase imaging. 3. Large hiatal hernia. 4. Cardiomegaly.  CTA Chest 07/06/21 Cardiovascular: The heart is enlarged and there is no pericardial effusion. A few scattered coronary artery calcifications are noted. There is atherosclerotic calcification of the aorta without evidence of aneurysm. The pulmonary trunk is normal in caliber. No large central pulmonary artery filling defect. Evaluation of the segmental and subsegmental arteries is limited due to mixing artifact and respiratory motion.   Mediastinum/Nodes: Shotty lymph nodes are present in the mediastinum. There are nonspecific prominent hilar lymph nodes bilaterally. No axillary lymphadenopathy is seen. The thyroid gland, trachea, and esophagus are within normal limits. There is a moderate to large hiatal hernia.   Lungs/Pleura: Subpleural fibrotic changes and atelectasis are noted bilaterally. No effusion or pneumothorax is seen.   Upper Abdomen: There is a moderate to large hiatal hernia.  PFT:     No data to display         Labs:  Path:  Echo 2019: LV EF 65-70%. Grade I diastolic dysfunction. RV size and systolic normal.   Heart Catheterization:  Assessment & Plan:   ILD (interstitial lung disease) ([place]) - Plan: Pulse oximetry, overnight, Pulmonary Function Test  Mild persistent reactive airway disease without complication  Discussion: Alison York is an 82 year old woman, former smoker with CKD stage III, pulmonary embolus, GERD and asthma who returns to pulmonary clinic for pulmonary fibrosis.  She is to continue esbriet 829m three times daily. Check CMP today.  She is to use budesonide and duoneb treatments as needed for her reactive airways disease.   She has recovered well from her recent hospitalization with covid and RSV.   [facility] [place]   For GERD, she is to continue PPI therapy along with sleeping with the head of her bed elevated.     Follow-up in 3 months with HRCT Chest and PFTs.  JFreda Jackson MD [facility] Office: [phone]  Current Outpatient Medications:    albuterol (VENTOLIN HFA) 108 (90 Base) MCG/ACT inhaler, Inhale 2 puffs into the lungs every 4 (four) hours as needed for wheezing or shortness of breath., Disp: , Rfl:    ALPRAZolam (XANAX) 0.5 MG tablet, Take 0.5 mg by mouth every 8 (eight) hours as needed for anxiety. , Disp: , Rfl:    budesonide (PULMICORT) 0.5 MG/2ML nebulizer solution, Take 2 mLs (0.5 mg total) by nebulization 2 (two) times daily., Disp: 120 mL, Rfl: 11   Calcium-Vitamin D 600-125 MG-UNIT TABS, Take 1 tablet by mouth 2 (two) times a week. , Disp: , Rfl:    ferrous sulfate 325 (65 FE) MG tablet, Take 325 mg by mouth daily with breakfast., Disp: , Rfl:    gabapentin (NEURONTIN) 600 MG tablet, Take 600 mg by mouth 2 (two) times daily., Disp: , Rfl:    ipratropium-albuterol (DUONEB) 0.5-2.5 (3) MG/3ML SOLN, Take 3 mLs by nebulization every 6 (six) hours as needed., Disp: 360 mL, Rfl: 0   Multiple  Vitamin (MULTIVITAMIN) capsule, Take 1 capsule by mouth 2 (two) times a week. , Disp: , Rfl:    omeprazole (PRILOSEC) 40 MG capsule, Take 1 capsule (40 mg total) by mouth 2 (two) times daily., Disp: 60 capsule, Rfl: 3   PARoxetine (PAXIL) 20 MG tablet, Take 20 mg by mouth daily at 3 pm. , Disp: , Rfl:    Pirfenidone (ESBRIET) 267 MG TABS, Take 3 tablets (801 mg total) by mouth 3 (three) times daily with meals., Disp: 810 tablet, Rfl: 1   pravastatin (PRAVACHOL) 80 MG tablet, Take 1 tablet (80 mg total) by mouth every evening., Disp: 90 tablet, Rfl: 3

## 2022-10-08 NOTE — Patient Instructions (Addendum)
Continue esbriet 3 times daily   CT Chest scan is scheduled in May   Will check pulmonary function tests in May  Recommend resuming pulmonary rehab to help with your conditioning.   Follow up in 3 months after your CT chest scan with pulmonary function tests

## 2022-10-09 ENCOUNTER — Ambulatory Visit: Payer: Medicare HMO | Admitting: Pulmonary Disease

## 2022-10-10 DIAGNOSIS — K219 Gastro-esophageal reflux disease without esophagitis: Secondary | ICD-10-CM | POA: Diagnosis not present

## 2022-10-10 DIAGNOSIS — E7800 Pure hypercholesterolemia, unspecified: Secondary | ICD-10-CM | POA: Diagnosis not present

## 2022-10-10 DIAGNOSIS — F419 Anxiety disorder, unspecified: Secondary | ICD-10-CM | POA: Diagnosis not present

## 2022-10-10 DIAGNOSIS — G2581 Restless legs syndrome: Secondary | ICD-10-CM | POA: Diagnosis not present

## 2022-10-10 DIAGNOSIS — M810 Age-related osteoporosis without current pathological fracture: Secondary | ICD-10-CM | POA: Diagnosis not present

## 2022-10-10 DIAGNOSIS — N183 Chronic kidney disease, stage 3 unspecified: Secondary | ICD-10-CM | POA: Diagnosis not present

## 2022-10-10 DIAGNOSIS — R7303 Prediabetes: Secondary | ICD-10-CM | POA: Diagnosis not present

## 2022-10-10 DIAGNOSIS — J849 Interstitial pulmonary disease, unspecified: Secondary | ICD-10-CM | POA: Diagnosis not present

## 2022-10-10 DIAGNOSIS — E559 Vitamin D deficiency, unspecified: Secondary | ICD-10-CM | POA: Diagnosis not present

## 2022-10-17 DIAGNOSIS — R2681 Unsteadiness on feet: Secondary | ICD-10-CM | POA: Diagnosis not present

## 2022-10-17 DIAGNOSIS — J84112 Idiopathic pulmonary fibrosis: Secondary | ICD-10-CM | POA: Diagnosis not present

## 2022-10-17 DIAGNOSIS — M2560 Stiffness of unspecified joint, not elsewhere classified: Secondary | ICD-10-CM | POA: Diagnosis not present

## 2022-10-17 DIAGNOSIS — M6281 Muscle weakness (generalized): Secondary | ICD-10-CM | POA: Diagnosis not present

## 2022-10-21 DIAGNOSIS — G4730 Sleep apnea, unspecified: Secondary | ICD-10-CM | POA: Diagnosis not present

## 2022-10-21 DIAGNOSIS — R0683 Snoring: Secondary | ICD-10-CM | POA: Diagnosis not present

## 2022-11-01 DIAGNOSIS — K920 Hematemesis: Secondary | ICD-10-CM | POA: Diagnosis not present

## 2022-11-01 DIAGNOSIS — R350 Frequency of micturition: Secondary | ICD-10-CM | POA: Diagnosis not present

## 2022-11-01 DIAGNOSIS — M5431 Sciatica, right side: Secondary | ICD-10-CM | POA: Diagnosis not present

## 2022-11-01 DIAGNOSIS — M25551 Pain in right hip: Secondary | ICD-10-CM | POA: Diagnosis not present

## 2022-11-07 DIAGNOSIS — M5416 Radiculopathy, lumbar region: Secondary | ICD-10-CM | POA: Diagnosis not present

## 2022-11-12 DIAGNOSIS — M1610 Unilateral primary osteoarthritis, unspecified hip: Secondary | ICD-10-CM | POA: Diagnosis not present

## 2022-11-12 DIAGNOSIS — M545 Low back pain, unspecified: Secondary | ICD-10-CM | POA: Diagnosis not present

## 2022-11-16 ENCOUNTER — Telehealth: Payer: Self-pay | Admitting: Pulmonary Disease

## 2022-11-16 NOTE — Telephone Encounter (Signed)
Please let patient know her ONO results show 3hr and 11 min with an SpO2 less than 88%. Recommend that she use 2L of oxygen when sleeping at night. Please placed home O2 orders if she is ok with starting oxygen therapy.  Thanks, JD

## 2022-11-18 DIAGNOSIS — M4316 Spondylolisthesis, lumbar region: Secondary | ICD-10-CM | POA: Diagnosis not present

## 2022-11-18 DIAGNOSIS — M5116 Intervertebral disc disorders with radiculopathy, lumbar region: Secondary | ICD-10-CM | POA: Diagnosis not present

## 2022-11-18 DIAGNOSIS — M545 Low back pain, unspecified: Secondary | ICD-10-CM | POA: Diagnosis not present

## 2022-11-18 DIAGNOSIS — M48061 Spinal stenosis, lumbar region without neurogenic claudication: Secondary | ICD-10-CM | POA: Diagnosis not present

## 2022-11-18 NOTE — Telephone Encounter (Signed)
Went over [employer] with patient. Patient advises she has a o2 tank and supplies at home. Dr. Erin Fulling  just an [facility]

## 2022-11-18 NOTE — Telephone Encounter (Signed)
thanks

## 2022-11-20 DIAGNOSIS — M1611 Unilateral primary osteoarthritis, right hip: Secondary | ICD-10-CM | POA: Diagnosis not present

## 2022-11-21 DIAGNOSIS — M545 Low back pain, unspecified: Secondary | ICD-10-CM | POA: Diagnosis not present

## 2022-11-21 DIAGNOSIS — M1610 Unilateral primary osteoarthritis, unspecified hip: Secondary | ICD-10-CM | POA: Diagnosis not present

## 2022-12-06 DIAGNOSIS — M5136 Other intervertebral disc degeneration, lumbar region: Secondary | ICD-10-CM | POA: Diagnosis not present

## 2022-12-06 DIAGNOSIS — M4727 Other spondylosis with radiculopathy, lumbosacral region: Secondary | ICD-10-CM | POA: Diagnosis not present

## 2022-12-06 DIAGNOSIS — M5416 Radiculopathy, lumbar region: Secondary | ICD-10-CM | POA: Diagnosis not present

## 2022-12-31 ENCOUNTER — Ambulatory Visit (HOSPITAL_COMMUNITY)
Admission: RE | Admit: 2022-12-31 | Discharge: 2022-12-31 | Disposition: A | Discharge status: Home / Self Care | Payer: Medicare HMO | Source: Ambulatory Visit | Attending: Pulmonary Disease | Admitting: Pulmonary Disease

## 2022-12-31 DIAGNOSIS — J849 Interstitial pulmonary disease, unspecified: Secondary | ICD-10-CM | POA: Diagnosis not present

## 2022-12-31 DIAGNOSIS — J8410 Pulmonary fibrosis, unspecified: Secondary | ICD-10-CM | POA: Diagnosis not present

## 2023-01-10 DIAGNOSIS — E538 Deficiency of other specified B group vitamins: Secondary | ICD-10-CM | POA: Diagnosis not present

## 2023-01-10 DIAGNOSIS — J849 Interstitial pulmonary disease, unspecified: Secondary | ICD-10-CM | POA: Diagnosis not present

## 2023-01-10 DIAGNOSIS — N183 Chronic kidney disease, stage 3 unspecified: Secondary | ICD-10-CM | POA: Diagnosis not present

## 2023-01-10 DIAGNOSIS — E559 Vitamin D deficiency, unspecified: Secondary | ICD-10-CM | POA: Diagnosis not present

## 2023-01-10 DIAGNOSIS — Z6841 Body Mass Index (BMI) 40.0 and over, adult: Secondary | ICD-10-CM | POA: Diagnosis not present

## 2023-01-10 DIAGNOSIS — E7800 Pure hypercholesterolemia, unspecified: Secondary | ICD-10-CM | POA: Diagnosis not present

## 2023-01-10 DIAGNOSIS — M810 Age-related osteoporosis without current pathological fracture: Secondary | ICD-10-CM | POA: Diagnosis not present

## 2023-01-10 DIAGNOSIS — R7303 Prediabetes: Secondary | ICD-10-CM | POA: Diagnosis not present

## 2023-01-24 DIAGNOSIS — L814 Other melanin hyperpigmentation: Secondary | ICD-10-CM | POA: Diagnosis not present

## 2023-01-24 DIAGNOSIS — L578 Other skin changes due to chronic exposure to nonionizing radiation: Secondary | ICD-10-CM | POA: Diagnosis not present

## 2023-01-24 DIAGNOSIS — L820 Inflamed seborrheic keratosis: Secondary | ICD-10-CM | POA: Diagnosis not present

## 2023-02-19 ENCOUNTER — Telehealth: Payer: Self-pay

## 2023-02-19 NOTE — Telephone Encounter (Signed)
Received fax from [place] stating that they have been "unable to contact pt to discuss ongoing eligibility for 2025". Reached out to pt who, like everyone else, confirms that they have NOT been contacted by [place] whatsoever. Advised pt to f/u with them to provide information, the phone number was provided to pt.  Will await response from [place] regarding pt's eligibility status for 2025, however nothing further is needed at this time.

## 2023-03-20 ENCOUNTER — Ambulatory Visit: Payer: Medicare HMO | Admitting: Pulmonary Disease

## 2023-05-19 ENCOUNTER — Telehealth: Payer: Self-pay | Admitting: Pharmacist

## 2023-05-19 NOTE — Telephone Encounter (Signed)
Received fax from [place] [facility]. Patient has been approved to continue receiving Esbriet free of charge from patient assistance program through end of 2025. Patient's eligibiltiy will be re-evaluated on an annual basis  [place] phone: [phone]  Chesley Mires, PharmD, MPH, BCPS, CPP Clinical Pharmacist (Rheumatology and Pulmonology)

## 2023-05-27 DIAGNOSIS — M5416 Radiculopathy, lumbar region: Secondary | ICD-10-CM | POA: Diagnosis not present

## 2023-06-06 ENCOUNTER — Ambulatory Visit: Payer: Medicare HMO | Admitting: Pulmonary Disease

## 2023-06-06 DIAGNOSIS — J849 Interstitial pulmonary disease, unspecified: Secondary | ICD-10-CM | POA: Diagnosis not present

## 2023-06-06 LAB — PULMONARY FUNCTION TEST
DL/VA % pred: 82 %
DL/VA: 3.31 ml/min/mmHg/L
DLCO cor % pred: 56 %
DLCO cor: 10.95 ml/min/mmHg
DLCO unc % pred: 56 %
DLCO unc: 10.95 ml/min/mmHg
FEF 25-75 Pre: 1.65 L/s
FEF2575-%Pred-Pre: 120 %
FEV1-%Pred-Pre: 87 %
FEV1-Pre: 1.72 L
FEV1FVC-%Pred-Pre: 109 %
FEV6-%Pred-Pre: 85 %
FEV6-Pre: 2.12 L
FEV6FVC-%Pred-Pre: 105 %
FVC-%Pred-Pre: 80 %
FVC-Pre: 2.13 L
Pre FEV1/FVC ratio: 81 %
Pre FEV6/FVC Ratio: 100 %
RV % pred: 107 %
RV: 2.67 L
TLC % pred: 92 %
TLC: 4.8 L

## 2023-06-06 NOTE — Progress Notes (Signed)
Spirometry/DLCO and lung volumes performed today. 

## 2023-06-06 NOTE — Patient Instructions (Signed)
Spirometry/DLCO and lung volumes performed today. 

## 2023-06-15 DIAGNOSIS — M5441 Lumbago with sciatica, right side: Secondary | ICD-10-CM | POA: Diagnosis not present

## 2023-06-15 DIAGNOSIS — Z885 Allergy status to narcotic agent status: Secondary | ICD-10-CM | POA: Diagnosis not present

## 2023-06-15 DIAGNOSIS — Z87891 Personal history of nicotine dependence: Secondary | ICD-10-CM | POA: Diagnosis not present

## 2023-06-15 DIAGNOSIS — M1990 Unspecified osteoarthritis, unspecified site: Secondary | ICD-10-CM | POA: Diagnosis not present

## 2023-06-15 DIAGNOSIS — M5431 Sciatica, right side: Secondary | ICD-10-CM | POA: Diagnosis not present

## 2023-06-15 DIAGNOSIS — E785 Hyperlipidemia, unspecified: Secondary | ICD-10-CM | POA: Diagnosis not present

## 2023-06-15 DIAGNOSIS — Z888 Allergy status to other drugs, medicaments and biological substances status: Secondary | ICD-10-CM | POA: Diagnosis not present

## 2023-06-15 DIAGNOSIS — Z79899 Other long term (current) drug therapy: Secondary | ICD-10-CM | POA: Diagnosis not present

## 2023-06-15 DIAGNOSIS — K219 Gastro-esophageal reflux disease without esophagitis: Secondary | ICD-10-CM | POA: Diagnosis not present

## 2023-06-15 DIAGNOSIS — F419 Anxiety disorder, unspecified: Secondary | ICD-10-CM | POA: Diagnosis not present

## 2023-06-25 DIAGNOSIS — M5416 Radiculopathy, lumbar region: Secondary | ICD-10-CM | POA: Diagnosis not present

## 2023-06-25 DIAGNOSIS — Z23 Encounter for immunization: Secondary | ICD-10-CM | POA: Diagnosis not present

## 2023-06-25 DIAGNOSIS — F419 Anxiety disorder, unspecified: Secondary | ICD-10-CM | POA: Diagnosis not present

## 2023-06-25 DIAGNOSIS — M25561 Pain in right knee: Secondary | ICD-10-CM | POA: Diagnosis not present

## 2023-07-03 ENCOUNTER — Telehealth: Payer: Self-pay | Admitting: Pulmonary Disease

## 2023-07-03 NOTE — Telephone Encounter (Signed)
Patient needs surgical clearance for surgery that has not been scheduled yet. She was last seen in office in February 2024 (PFT was done in [place] has an upcoming appointment on December 5th.

## 2023-07-07 NOTE — Telephone Encounter (Signed)
I have not received anything from outside office about this pt needing risk assessment  I called and spoke with her  She is having spinal surgery with Dr Dutch Quint- date of surgery is pending   She is scheduled with Dr Francine Graven for appt on 07/31/23- added to appt notes that she needs risk assessment done. Pt aware to keep this appt so we can get her cleared.

## 2023-07-07 NOTE — Telephone Encounter (Signed)
Patient states surgeon is Lelon Perla MD. Patient phone number is [phone].

## 2023-07-11 ENCOUNTER — Telehealth: Payer: Self-pay | Admitting: Pulmonary Disease

## 2023-07-11 NOTE — Telephone Encounter (Signed)
Fax received from Dr. Julio Sicks with [facility] (fax [phone]) to perform a L4, L5-S1 Laminectomy-Foraminotomy  on patient.  Patient needs surgery clearance. Surgery is pending clearance. Patient was seen on 10/08/22. Office protocol is a risk assessment can be sent to surgeon if patient has been seen in 60 days or less.   Pt is scheduled for appt with Dr Francine Graven 07/31/23 and will hold until risk assessment is done.

## 2023-07-11 NOTE — Telephone Encounter (Signed)
PT wondering when she will have the ONO test done. Pls call to advise @ [phone]

## 2023-07-14 ENCOUNTER — Encounter: Payer: Self-pay | Admitting: Pulmonary Disease

## 2023-07-14 ENCOUNTER — Ambulatory Visit: Payer: Medicare HMO | Admitting: Pulmonary Disease

## 2023-07-14 VITALS — BP 130/64 | HR 76 | Temp 98.3°F | Ht 64.0 in | Wt 243.0 lb

## 2023-07-14 DIAGNOSIS — J849 Interstitial pulmonary disease, unspecified: Secondary | ICD-10-CM

## 2023-07-14 NOTE — Progress Notes (Unsigned)
Synopsis: Referred in February 2023 for pulmonary fibrosis by Lonie Peak, PA  Subjective:   PATIENT ID: Alison York: female DOB: Apr 06, 1941, MRN: [id number]  HPI  Chief Complaint  Patient presents with   Follow-up    Needs risk assessment for back surgery. Breathing is overall doing well. She has to sleep on her side due to SOB when she lies down.    Alison York is an 82 year old woman, former smoker with CKD stage III, pulmonary embolus, GERD and asthma who returns to pulmonary clinic for evaluation of pulmonary fibrosis.  She is scheduled for a back surgery due to compressed nerves. The patient reports experiencing pain in the back, legs, and hip. The pain has been severe enough to affect the patient's mobility, particularly in the legs. The patient also mentions having a bad knee, which was further aggravated by a fall. The patient is currently on pirfenidone, taken three times a day, and uses oxygen therapy, primarily at night or when performing strenuous activities. The patient reports difficulty breathing when lying on her back without oxygen but manages well on her sides or with oxygen. No recent respiratory infections.  OV 10/08/22 She was admitted 08/30/2022 for Covid and RSV infection leading to acute exacerbation in her pulmonary fibrosis. She was treated with extended steroid taper and supplemental oxygen.   She is not experiencing wheezing. She has intermittent cough and mucous production. She complains of shortness of breath when she is just waking up. She recently stopped going to pulmonary rehab at [facility] in [place].  OV 07/01/22 She was started on esbriet 11/2021. She is taking 3 tablets 3 times per day. She will be getting 801mg  tablets this Friday. No GI issues. No skin sensitivity.   She is using the albuterol/ipratropium and budesonide nebulizer treatments as needed.   She is having issues of pain on her left side due to a fracture in a vertebrae. She is taking  gabapentin for nerve pain. She could not tolerate narcotics.   OV 11/27/21 PFTs 01/02/21 from [facility] showed FEV1/FVC 81, FVC 2.19L (82%), FEV1 1.77L (87%), TLC 67% and DLCO 56%  HRCT Chest 11/23/21 shows mild pulmonary fibrosis pattern, categorized as probable UIP. Mild tracheobronchomalacia and large hiatal hernia.   She reports her GERD is better with elevating the head of her bed.   She hasn't required nebulizer treatments since completing course of prednisone. Feels her breathing is stable at this time.   OV 10/18/21 She has been followed by Dr. Blenda Nicely at [facility] pulmonology who has diagnosed her with pulmonary fibrosis and has recommended pirfenidone therapy.  She wanted a second opinion and has been referred to our clinic.  She is currently taking a steroid taper which has significantly helped her breathing as it has eliminated cough and wheezing and reduced her shortness of breath.  She is currently using as needed albuterol inhaler or nebulizer treatments which provide some relief.  After finishing steroid tapers in the past her cough wheezing and shortness of breath have returned.  She has significant history of GERD and a moderate-sized hiatal hernia on chest imaging.  She had EGD in 2022 which was unremarkable except for the hiatal hernia.  She is taking omeprazole 40 mg daily.  She denies any seasonal allergies and she denies any sinus congestion or postnasal drainage.  She has about a 20-pack-year smoking history and quit at age 43.  She previously worked at [employer] with significant dust exposure.  No  family history of lung disease.  Her son has multiple myeloma and her husband has recently been diagnosed with dementia.  Past Medical History:  Diagnosis Date   Anemia    Anxiety state, unspecified    Cataract    Cerebral hemorrhage (HCC) 1995   Dysphagia    Erosive esophagitis    Esophageal reflux    Esophageal stricture    Hiatal hernia    Hypercholesterolemia     Hypertension    Internal hemorrhoids    Osteoporosis    Personal history of colonic polyps 1999 & 11/04/2011   villous adenoma & tubular adenoma   Prediabetes    Pulmonary embolism (HCC) 2020   Restless legs syndrome (RLS)    Shortness of breath    Vitamin D deficiency      Family History  Problem Relation Age of Onset   Hypertension Father    Stroke Father        ministrokes start in 4's   Tuberculosis Mother    CAD Mother    Breast cancer Sister    Heart failure Sister        possible CAD   Diabetes Sister    Mental illness Sister    Alzheimer's disease Sister    Breast cancer Daughter    Kidney cancer Brother    Colon cancer Neg Hx    Colon polyps Neg Hx    Esophageal cancer Neg Hx    Rectal cancer Neg Hx    Stomach cancer Neg Hx      Social History   Socioeconomic History   Marital status: Married    Spouse name: Not on file   Number of children: 2   Years of education: Not on file   Highest education level: Not on file  Occupational History   Occupation: retired works part time at [employer]  Tobacco Use   Smoking status: Former    Current packs/day: 0.00    Types: Cigarettes    Quit date: 08/26/1972    Years since quitting: 50.9   Smokeless tobacco: Never  Vaping Use   Vaping status: Never Used  Substance and Sexual Activity   Alcohol use: No   Drug use: No   Sexual activity: Not on file  Other Topics Concern   Not on file  Social History Narrative   Not on file   Social Determinants of Health   Financial Resource Strain: Low Risk  (08/30/2022)   Received from [facility], [facility]   Overall Financial Resource Strain (CARDIA)    Difficulty of Paying Living Expenses: Not hard at all  Food Insecurity: Unknown (08/30/2022)   [facility], [facility]   Hunger Vital Sign    Worried About Running Out of Food in the Last Year: Not on file    Ran Out of Food in the Last Year: Never true  Transportation Needs: No  Transportation Needs (08/30/2022)   Received from [facility], [facility]   [facility] - Transportation    Lack of Transportation (Medical): No    Lack of Transportation (Non-Medical): No  Physical Activity: Not on file  Stress: No Stress Concern Present (08/30/2022)   Received from [facility], [facility]   [facility] - Occupational Stress Questionnaire    Feeling of Stress : Only a little  Social Connections: Moderately Integrated (08/30/2022)   Received from [facility], [facility]   Social  Connection and Isolation Panel [NHANES]    Frequency of Communication with Friends and Family: More than three times a week    Frequency of Social Gatherings with Friends and Family: More than three times a week    Attends Religious Services: 1 to 4 times per year    Active Member of [employer] or Organizations: No    Attends Banker Meetings: Never    Marital Status: Married  Catering manager Violence: Not At Risk (08/30/2022)   Received from [facility], [facility]   Humiliation, Afraid, Rape, and Kick questionnaire    Fear of Current or Ex-Partner: No    Emotionally Abused: No    Physically Abused: No    Sexually Abused: No     Allergies  Allergen Reactions   Oxycodone Shortness Of Breath   Simvastatin Other (See Comments)    Leg pain     Outpatient Medications Prior to Visit  Medication Sig Dispense Refill   albuterol (VENTOLIN HFA) 108 (90 Base) MCG/ACT inhaler Inhale 2 puffs into the lungs every 4 (four) hours as needed for wheezing or shortness of breath.     ALPRAZolam (XANAX) 0.5 MG tablet Take 0.5 mg by mouth every 8 (eight) hours as needed for anxiety.      budesonide (PULMICORT) 0.5 MG/2ML nebulizer solution Take 2 mLs (0.5 mg total) by nebulization 2 (two) times daily. 120 mL 11   Calcium-Vitamin D 600-125 MG-UNIT TABS Take 1 tablet by mouth 2 (two) times a week.      ferrous sulfate 325 (65 FE) MG tablet Take  325 mg by mouth daily with breakfast.     gabapentin (NEURONTIN) 600 MG tablet Take 600 mg by mouth 2 (two) times daily.     HYDROcodone-acetaminophen (NORCO) 10-325 MG tablet Take 1 tablet by mouth every 4 (four) hours as needed.     ipratropium-albuterol (DUONEB) 0.5-2.5 (3) MG/3ML SOLN Take 3 mLs by nebulization every 6 (six) hours as needed. 360 mL 0   Multiple Vitamin (MULTIVITAMIN) capsule Take 1 capsule by mouth 2 (two) times a week.      omeprazole (PRILOSEC) 40 MG capsule Take 1 capsule (40 mg total) by mouth 2 (two) times daily. 60 capsule 3   PARoxetine (PAXIL) 20 MG tablet Take 20 mg by mouth daily at 3 pm.      Pirfenidone (ESBRIET) 267 MG TABS Take 3 tablets (801 mg total) by mouth 3 (three) times daily with meals. 810 tablet 1   pravastatin (PRAVACHOL) 80 MG tablet Take 1 tablet (80 mg total) by mouth every evening. 90 tablet 3   No facility-administered medications prior to visit.   Review of Systems  Constitutional:  Negative for chills, fever, malaise/fatigue and weight loss.  HENT:  Negative for congestion, sinus pain and sore throat.   Eyes: Negative.   Respiratory:  Positive for shortness of breath. Negative for cough, hemoptysis, sputum production and wheezing.   Cardiovascular:  Negative for chest pain, palpitations, orthopnea, claudication and leg swelling.  Gastrointestinal:  Positive for heartburn. Negative for abdominal pain, nausea and vomiting.  Genitourinary: Negative.   Musculoskeletal:  Positive for back pain. Negative for joint pain and myalgias.  Skin:  Negative for rash.  Neurological:  Negative for weakness.  Endo/Heme/Allergies: Negative.   Psychiatric/Behavioral: Negative.     Objective:   Vitals:   07/14/23 1557  BP: 130/64  Pulse: 76  Temp: 98.3 F (36.8 C)  TempSrc: Oral  SpO2: 98%  Weight: 243 lb (  110.2 kg)  Height: 5\' 4"  (1.626 m)   Physical Exam Constitutional:      General: She is not in acute distress.    Appearance: She is  obese. She is not ill-appearing.  HENT:     Head: Normocephalic and atraumatic.  Eyes:     General: No scleral icterus.    Conjunctiva/sclera: Conjunctivae normal.  Cardiovascular:     Rate and Rhythm: Normal rate and regular rhythm.     Pulses: Normal pulses.     Heart sounds: Normal heart sounds. No murmur heard. Pulmonary:     Effort: Pulmonary effort is normal.     Breath sounds: Rales (bibasilar) present. No wheezing or rhonchi.  Musculoskeletal:     Right lower leg: No edema.     Left lower leg: No edema.  Skin:    General: Skin is warm and dry.  Neurological:     General: No focal deficit present.     Mental Status: She is alert.    CBC    Component Value Date/Time   WBC 8.7 07/14/2023 1636   RBC 3.89 07/14/2023 1636   HGB 9.9 (L) 07/14/2023 1636   HCT 31.4 (L) 07/14/2023 1636   PLT 293.0 07/14/2023 1636   MCV 80.7 07/14/2023 1636   MCH 28.5 07/05/2021 1549   MCHC 31.4 07/14/2023 1636   RDW 17.7 (H) 07/14/2023 1636   LYMPHSABS 2.2 07/14/2023 1636   MONOABS 0.8 07/14/2023 1636   EOSABS 0.2 07/14/2023 1636   BASOSABS 0.1 07/14/2023 1636      Latest Ref Rng & Units 07/14/2023    4:36 PM 07/01/2022    4:27 PM 12/05/2021    1:40 PM  BMP  Glucose 70 - 99 mg/dL 96  89  95   BUN 6 - 23 mg/dL 13  19  17    Creatinine 0.40 - 1.20 mg/dL 7.82  9.56  2.13   Sodium 135 - 145 mEq/L 139  138  137   Potassium 3.5 - 5.1 mEq/L 4.3  4.5  4.4   Chloride 96 - 112 mEq/L 103  102  101   CO2 19 - 32 mEq/L 26  28  28    Calcium 8.4 - 10.5 mg/dL 9.6  9.5  9.2    Chest imaging: HRCT Chest 12/31/22 1. Image quality is degraded by respiratory motion, especially in the lung bases. Pulmonary parenchymal pattern of fibrosis appears grossly stable from comparison exams and is likely due to usual interstitial pneumonitis. Findings are categorized as probable UIP per consensus guidelines: Diagnosis of Idiopathic Pulmonary Fibrosis: An Official ATS/ERS/JRS/ALAT Clinical Practice  Guideline. Am Rosezetta Schlatter Crit Care Med Vol 198, Iss 5, [phone], Apr 26 2017. 2. Dilated esophagus containing fluid and food debris, indicative of dysmotility. Moderate to large hiatal hernia. 3. Aortic atherosclerosis (ICD10-I70.0). Coronary artery calcification. 4. Enlarged pulmonic trunk, indicative of pulmonary arterial hypertension.   HRCT Chest 11/23/21 1. Mild pulmonary fibrosis in a pattern with apical to basal gradient, featuring irregular peripheral interstitial opacity, septal thickening, and subpleural bronchiolectasis at the lung bases. Comparison to prior expiratory and motion limited CT angiograms dating back to 05/19/2019 is difficult however fibrotic findings are not grossly changed over this relatively short period of time. Findings are categorized as probable UIP per consensus guidelines: Diagnosis of Idiopathic Pulmonary Fibrosis: An Official ATS/ERS/JRS/ALAT Clinical Practice Guideline. Am Rosezetta Schlatter Crit Care Med Vol 198, Iss 5, [id number], Apr 26 2017. 2. Suspect mild tracheobronchomalacia on expiratory phase imaging. 3. Large hiatal hernia. 4.  Cardiomegaly.  CTA Chest 07/06/21 Cardiovascular: The heart is enlarged and there is no pericardial effusion. A few scattered coronary artery calcifications are noted. There is atherosclerotic calcification of the aorta without evidence of aneurysm. The pulmonary trunk is normal in caliber. No large central pulmonary artery filling defect. Evaluation of the segmental and subsegmental arteries is limited due to mixing artifact and respiratory motion.   Mediastinum/Nodes: Shotty lymph nodes are present in the mediastinum. There are nonspecific prominent hilar lymph nodes bilaterally. No axillary lymphadenopathy is seen. The thyroid gland, trachea, and esophagus are within normal limits. There is a moderate to large hiatal hernia.   Lungs/Pleura: Subpleural fibrotic changes and atelectasis are noted bilaterally. No  effusion or pneumothorax is seen.   Upper Abdomen: There is a moderate to large hiatal hernia.  PFT:    Latest Ref Rng & Units 06/06/2023    1:59 PM  PFT Results  FVC-Pre L 2.13  P  FVC-Predicted Pre % 80  P  Pre FEV1/FVC % % 81  P  FEV1-Pre L 1.72  P  FEV1-Predicted Pre % 87  P  DLCO uncorrected ml/min/mmHg 10.95  P  DLCO UNC% % 56  P  DLCO corrected ml/min/mmHg 10.95  P  DLCO COR %Predicted % 56  P  DLVA Predicted % 82  P  TLC L 4.80  P  TLC % Predicted % 92  P  RV % Predicted % 107  P    P Preliminary result   Labs:  Path:  Echo 2019: LV EF 65-70%. Grade I diastolic dysfunction. RV size and systolic normal.   Heart Catheterization:  Assessment & Plan:   ILD (interstitial lung disease) (HCC) - Plan: CBC with Differential/Platelet, Comp Met (CMET), Comp Met (CMET), CBC with Differential/Platelet  Discussion: Alison York is an 81 year old woman, former smoker with CKD stage III, pulmonary embolus, GERD and asthma who returns to pulmonary clinic for pulmonary fibrosis.  She is to continue esbriet 801mg  three times daily. Check CMP today.  She is to use budesonide and duoneb treatments as needed for her reactive airways disease.   She has anemia based on lab evaluation today. Chemistry panel is within normal limits.  ARISCAT Score for Postoperative Pulmonary Complications Low to Intermediate risk 1.6% - 13.3% risk of in-hospital post-op pulmonary complications (composite including respiratory failure, respiratory infection, pleural effusion, atelectasis, pneumothorax, bronchospasm, aspiration pneumonitis)  Follow up in 4 months.  Melody Comas, MD [facility] Office: [phone]   Current Outpatient Medications:    albuterol (VENTOLIN HFA) 108 (90 Base) MCG/ACT inhaler, Inhale 2 puffs into the lungs every 4 (four) hours as needed for wheezing or shortness of breath., Disp: , Rfl:    ALPRAZolam (XANAX) 0.5 MG tablet, Take 0.5 mg by  mouth every 8 (eight) hours as needed for anxiety. , Disp: , Rfl:    budesonide (PULMICORT) 0.5 MG/2ML nebulizer solution, Take 2 mLs (0.5 mg total) by nebulization 2 (two) times daily., Disp: 120 mL, Rfl: 11   Calcium-Vitamin D 600-125 MG-UNIT TABS, Take 1 tablet by mouth 2 (two) times a week. , Disp: , Rfl:    ferrous sulfate 325 (65 FE) MG tablet, Take 325 mg by mouth daily with breakfast., Disp: , Rfl:    gabapentin (NEURONTIN) 600 MG tablet, Take 600 mg by mouth 2 (two) times daily., Disp: , Rfl:    HYDROcodone-acetaminophen (NORCO) 10-325 MG tablet, Take 1 tablet by mouth every 4 (four) hours as needed., Disp: , Rfl:  ipratropium-albuterol (DUONEB) 0.5-2.5 (3) MG/3ML SOLN, Take 3 mLs by nebulization every 6 (six) hours as needed., Disp: 360 mL, Rfl: 0   Multiple Vitamin (MULTIVITAMIN) capsule, Take 1 capsule by mouth 2 (two) times a week. , Disp: , Rfl:    omeprazole (PRILOSEC) 40 MG capsule, Take 1 capsule (40 mg total) by mouth 2 (two) times daily., Disp: 60 capsule, Rfl: 3   PARoxetine (PAXIL) 20 MG tablet, Take 20 mg by mouth daily at 3 pm. , Disp: , Rfl:    Pirfenidone (ESBRIET) 267 MG TABS, Take 3 tablets (801 mg total) by mouth 3 (three) times daily with meals., Disp: 810 tablet, Rfl: 1   pravastatin (PRAVACHOL) 80 MG tablet, Take 1 tablet (80 mg total) by mouth every evening., Disp: 90 tablet, Rfl: 3

## 2023-07-14 NOTE — Patient Instructions (Addendum)
Low to Intermediate risk 1.6% - 13.3% risk of in-hospital post-op pulmonary complications (composite including respiratory failure, respiratory infection, pleural effusion, atelectasis, pneumothorax, bronchospasm, aspiration pneumonitis)  We will check basic lab work today  Continue pirfenidone 3 time daily  Continue pulmicort twice daily  Follow up in 4 months

## 2023-07-14 NOTE — Telephone Encounter (Signed)
Alison York; Alison York Last order for ono for this patient was received and done on 10/21/22

## 2023-07-14 NOTE — Telephone Encounter (Signed)
Spoke with patient regarding prior message.Patient did have a ONO done and was given result's in 10/2022.Patient stated she realized she had it done after she called. Patient's voice was understanding.Nothing else further needed.

## 2023-07-15 LAB — CBC WITH DIFFERENTIAL/PLATELET
Basophils Absolute: 0.1 10*3/uL (ref 0.0–0.1)
Basophils Relative: 1.4 % (ref 0.0–3.0)
Eosinophils Absolute: 0.2 10*3/uL (ref 0.0–0.7)
Eosinophils Relative: 1.8 % (ref 0.0–5.0)
HCT: 31.4 % — ABNORMAL LOW (ref 36.0–46.0)
Hemoglobin: 9.9 g/dL — ABNORMAL LOW (ref 12.0–15.0)
Lymphocytes Relative: 25.9 % (ref 12.0–46.0)
Lymphs Abs: 2.2 10*3/uL (ref 0.7–4.0)
MCHC: 31.4 g/dL (ref 30.0–36.0)
MCV: 80.7 fL (ref 78.0–100.0)
Monocytes Absolute: 0.8 10*3/uL (ref 0.1–1.0)
Monocytes Relative: 9.8 % (ref 3.0–12.0)
Neutro Abs: 5.3 10*3/uL (ref 1.4–7.7)
Neutrophils Relative %: 61.1 % (ref 43.0–77.0)
Platelets: 293 10*3/uL (ref 150.0–400.0)
RBC: 3.89 Mil/uL (ref 3.87–5.11)
RDW: 17.7 % — ABNORMAL HIGH (ref 11.5–15.5)
WBC: 8.7 10*3/uL (ref 4.0–10.5)

## 2023-07-15 LAB — COMPREHENSIVE METABOLIC PANEL
ALT: 8 U/L (ref 0–35)
AST: 17 U/L (ref 0–37)
Albumin: 4.2 g/dL (ref 3.5–5.2)
Alkaline Phosphatase: 54 U/L (ref 39–117)
BUN: 13 mg/dL (ref 6–23)
CO2: 26 meq/L (ref 19–32)
Calcium: 9.6 mg/dL (ref 8.4–10.5)
Chloride: 103 meq/L (ref 96–112)
Creatinine, Ser: 1.04 mg/dL (ref 0.40–1.20)
GFR: 49.96 mL/min — ABNORMAL LOW (ref >60.00)
Glucose, Bld: 96 mg/dL (ref 70–99)
Potassium: 4.3 meq/L (ref 3.5–5.1)
Sodium: 139 meq/L (ref 135–145)
Total Bilirubin: 0.4 mg/dL (ref 0.2–1.2)
Total Protein: 7.2 g/dL (ref 6.0–8.3)

## 2023-07-16 ENCOUNTER — Encounter: Payer: Self-pay | Admitting: Pulmonary Disease

## 2023-07-23 NOTE — Telephone Encounter (Signed)
Pt seen on 07/14/23- risk assessment done and I have faxed this to [facility]

## 2023-07-31 ENCOUNTER — Ambulatory Visit: Payer: Medicare HMO | Admitting: Pulmonary Disease

## 2023-09-18 DIAGNOSIS — Z6832 Body mass index (BMI) 32.0-32.9, adult: Secondary | ICD-10-CM | POA: Diagnosis not present

## 2023-09-18 DIAGNOSIS — M5416 Radiculopathy, lumbar region: Secondary | ICD-10-CM | POA: Diagnosis not present

## 2023-09-22 ENCOUNTER — Other Ambulatory Visit: Payer: Self-pay | Admitting: Neurosurgery

## 2023-09-22 DIAGNOSIS — M5416 Radiculopathy, lumbar region: Secondary | ICD-10-CM

## 2023-09-23 ENCOUNTER — Encounter: Payer: Self-pay | Admitting: Neurosurgery

## 2023-09-26 DIAGNOSIS — N183 Chronic kidney disease, stage 3 unspecified: Secondary | ICD-10-CM | POA: Diagnosis not present

## 2023-09-26 DIAGNOSIS — E7800 Pure hypercholesterolemia, unspecified: Secondary | ICD-10-CM | POA: Diagnosis not present

## 2023-09-26 DIAGNOSIS — N390 Urinary tract infection, site not specified: Secondary | ICD-10-CM | POA: Diagnosis not present

## 2023-09-26 DIAGNOSIS — J849 Interstitial pulmonary disease, unspecified: Secondary | ICD-10-CM | POA: Diagnosis not present

## 2023-09-26 DIAGNOSIS — E559 Vitamin D deficiency, unspecified: Secondary | ICD-10-CM | POA: Diagnosis not present

## 2023-09-26 DIAGNOSIS — M810 Age-related osteoporosis without current pathological fracture: Secondary | ICD-10-CM | POA: Diagnosis not present

## 2023-09-26 DIAGNOSIS — R7303 Prediabetes: Secondary | ICD-10-CM | POA: Diagnosis not present

## 2023-09-26 DIAGNOSIS — F419 Anxiety disorder, unspecified: Secondary | ICD-10-CM | POA: Diagnosis not present

## 2023-09-26 DIAGNOSIS — Z6841 Body Mass Index (BMI) 40.0 and over, adult: Secondary | ICD-10-CM | POA: Diagnosis not present

## 2023-09-26 DIAGNOSIS — E538 Deficiency of other specified B group vitamins: Secondary | ICD-10-CM | POA: Diagnosis not present

## 2023-09-26 DIAGNOSIS — Z79899 Other long term (current) drug therapy: Secondary | ICD-10-CM | POA: Diagnosis not present

## 2023-10-06 ENCOUNTER — Ambulatory Visit
Admission: RE | Admit: 2023-10-06 | Discharge: 2023-10-06 | Disposition: A | Discharge status: Home / Self Care | Payer: Medicare HMO | Source: Ambulatory Visit | Attending: Neurosurgery | Admitting: Neurosurgery

## 2023-10-06 DIAGNOSIS — M545 Low back pain, unspecified: Secondary | ICD-10-CM | POA: Diagnosis not present

## 2023-10-06 DIAGNOSIS — M5416 Radiculopathy, lumbar region: Secondary | ICD-10-CM

## 2023-10-06 DIAGNOSIS — G8929 Other chronic pain: Secondary | ICD-10-CM | POA: Diagnosis not present

## 2023-10-28 DIAGNOSIS — M5416 Radiculopathy, lumbar region: Secondary | ICD-10-CM | POA: Diagnosis not present

## 2024-03-26 DIAGNOSIS — R7303 Prediabetes: Secondary | ICD-10-CM | POA: Diagnosis not present

## 2024-03-26 DIAGNOSIS — E559 Vitamin D deficiency, unspecified: Secondary | ICD-10-CM | POA: Diagnosis not present

## 2024-03-26 DIAGNOSIS — S22040A Wedge compression fracture of fourth thoracic vertebra, initial encounter for closed fracture: Secondary | ICD-10-CM | POA: Diagnosis not present

## 2024-03-26 DIAGNOSIS — M8588 Other specified disorders of bone density and structure, other site: Secondary | ICD-10-CM | POA: Diagnosis not present

## 2024-03-26 DIAGNOSIS — E538 Deficiency of other specified B group vitamins: Secondary | ICD-10-CM | POA: Diagnosis not present

## 2024-03-26 DIAGNOSIS — M810 Age-related osteoporosis without current pathological fracture: Secondary | ICD-10-CM | POA: Diagnosis not present

## 2024-03-26 DIAGNOSIS — M546 Pain in thoracic spine: Secondary | ICD-10-CM | POA: Diagnosis not present

## 2024-03-26 DIAGNOSIS — N183 Chronic kidney disease, stage 3 unspecified: Secondary | ICD-10-CM | POA: Diagnosis not present

## 2024-03-26 DIAGNOSIS — M8580 Other specified disorders of bone density and structure, unspecified site: Secondary | ICD-10-CM | POA: Diagnosis not present

## 2024-03-26 DIAGNOSIS — M4854XA Collapsed vertebra, not elsewhere classified, thoracic region, initial encounter for fracture: Secondary | ICD-10-CM | POA: Diagnosis not present

## 2024-03-26 DIAGNOSIS — E7800 Pure hypercholesterolemia, unspecified: Secondary | ICD-10-CM | POA: Diagnosis not present

## 2024-03-26 DIAGNOSIS — J849 Interstitial pulmonary disease, unspecified: Secondary | ICD-10-CM | POA: Diagnosis not present

## 2024-03-26 DIAGNOSIS — Z79899 Other long term (current) drug therapy: Secondary | ICD-10-CM | POA: Diagnosis not present

## 2024-03-26 DIAGNOSIS — M5414 Radiculopathy, thoracic region: Secondary | ICD-10-CM | POA: Diagnosis not present

## 2024-04-06 DIAGNOSIS — R1012 Left upper quadrant pain: Secondary | ICD-10-CM | POA: Diagnosis not present

## 2024-04-07 DIAGNOSIS — R1012 Left upper quadrant pain: Secondary | ICD-10-CM | POA: Diagnosis not present

## 2024-04-07 DIAGNOSIS — K449 Diaphragmatic hernia without obstruction or gangrene: Secondary | ICD-10-CM | POA: Diagnosis not present

## 2024-04-20 DIAGNOSIS — K219 Gastro-esophageal reflux disease without esophagitis: Secondary | ICD-10-CM | POA: Diagnosis not present

## 2024-04-20 DIAGNOSIS — K449 Diaphragmatic hernia without obstruction or gangrene: Secondary | ICD-10-CM | POA: Diagnosis not present

## 2024-04-20 DIAGNOSIS — S22000A Wedge compression fracture of unspecified thoracic vertebra, initial encounter for closed fracture: Secondary | ICD-10-CM | POA: Diagnosis not present

## 2024-04-20 DIAGNOSIS — K2900 Acute gastritis without bleeding: Secondary | ICD-10-CM | POA: Diagnosis not present

## 2024-05-04 DIAGNOSIS — M5414 Radiculopathy, thoracic region: Secondary | ICD-10-CM | POA: Diagnosis not present

## 2024-05-04 DIAGNOSIS — K2900 Acute gastritis without bleeding: Secondary | ICD-10-CM | POA: Diagnosis not present

## 2024-05-04 DIAGNOSIS — K219 Gastro-esophageal reflux disease without esophagitis: Secondary | ICD-10-CM | POA: Diagnosis not present

## 2024-05-04 DIAGNOSIS — G2581 Restless legs syndrome: Secondary | ICD-10-CM | POA: Diagnosis not present

## 2024-05-19 DIAGNOSIS — M8589 Other specified disorders of bone density and structure, multiple sites: Secondary | ICD-10-CM | POA: Diagnosis not present

## 2024-05-19 DIAGNOSIS — Z1231 Encounter for screening mammogram for malignant neoplasm of breast: Secondary | ICD-10-CM | POA: Diagnosis not present

## 2024-06-04 DIAGNOSIS — Z23 Encounter for immunization: Secondary | ICD-10-CM | POA: Diagnosis not present

## 2024-06-04 DIAGNOSIS — M5414 Radiculopathy, thoracic region: Secondary | ICD-10-CM | POA: Diagnosis not present

## 2024-06-04 DIAGNOSIS — K219 Gastro-esophageal reflux disease without esophagitis: Secondary | ICD-10-CM | POA: Diagnosis not present

## 2024-06-04 DIAGNOSIS — K2900 Acute gastritis without bleeding: Secondary | ICD-10-CM | POA: Diagnosis not present

## 2024-06-04 DIAGNOSIS — G2581 Restless legs syndrome: Secondary | ICD-10-CM | POA: Diagnosis not present

## 2024-07-07 DIAGNOSIS — K219 Gastro-esophageal reflux disease without esophagitis: Secondary | ICD-10-CM | POA: Diagnosis not present

## 2024-07-07 DIAGNOSIS — J849 Interstitial pulmonary disease, unspecified: Secondary | ICD-10-CM | POA: Diagnosis not present

## 2024-07-07 DIAGNOSIS — K2900 Acute gastritis without bleeding: Secondary | ICD-10-CM | POA: Diagnosis not present

## 2024-07-07 DIAGNOSIS — G2581 Restless legs syndrome: Secondary | ICD-10-CM | POA: Diagnosis not present

## 2024-07-07 DIAGNOSIS — M5414 Radiculopathy, thoracic region: Secondary | ICD-10-CM | POA: Diagnosis not present

## 2024-09-08 ENCOUNTER — Telehealth: Payer: Self-pay | Admitting: Pulmonary Disease

## 2024-09-08 NOTE — Telephone Encounter (Signed)
 Fax received from Dr. Victory Gunnels with [facility] to perform a L3-4, L4-5 Lumbar Laminectomy under general anesthesia on patient.  Patient needs surgery clearance. Surgery is pending. Patient was seen on 07/14/23. Office protocol is a risk assessment can be sent to surgeon if patient has been seen in 60 days or less.   Pt needs appt  I called her and left msg for her to call back and scheduled  Will route to clearance pool for f/u

## 2024-09-20 ENCOUNTER — Ambulatory Visit: Admitting: Pulmonary Disease

## 2024-09-22 ENCOUNTER — Encounter: Payer: Self-pay | Admitting: Pulmonary Disease

## 2024-09-22 ENCOUNTER — Ambulatory Visit: Admitting: Pulmonary Disease

## 2024-09-22 VITALS — BP 117/62 | HR 74 | Ht 64.0 in | Wt 240.2 lb

## 2024-09-22 DIAGNOSIS — J849 Interstitial pulmonary disease, unspecified: Secondary | ICD-10-CM

## 2024-09-22 DIAGNOSIS — G4734 Idiopathic sleep related nonobstructive alveolar hypoventilation: Secondary | ICD-10-CM

## 2024-09-22 DIAGNOSIS — Z01811 Encounter for preprocedural respiratory examination: Secondary | ICD-10-CM

## 2024-09-22 DIAGNOSIS — R0902 Hypoxemia: Secondary | ICD-10-CM

## 2024-09-22 NOTE — Progress Notes (Signed)
 "  Established Patient Pulmonology Office Visit   Subjective:  Patient ID: Alison York, female    DOB: 18-Nov-1940  MRN: [id number]  CC:  Chief Complaint  Patient presents with   Medical Management of Chronic Issues    Pt states [place] - lower back     Discussed the use of AI scribe software for clinical note transcription with the patient, who gave verbal consent to proceed.  History of Present Illness Alison York is an 84 year old female with pulmonary fibrosis who presents for evaluation of her breathing and pre-operative clearance for back surgery.  She has pulmonary fibrosis with intermittent shortness of breath, worse at night. She uses oxygen  as needed at 2 L, not every night. Daytime dyspnea improves with sitting and rest. She has had no recent respiratory infections.  She previously used Esbriet  (pirfenidone ) but stopped over a year ago. She uses a home nebulizer and albuterol  inhaler as needed. She is not using Pulmicort  or Duoneb currently.  She has severe back pain from compressed nerves with leg symptoms and impaired balance, and she uses a walker. She is planning back surgery and takes gabapentin for pain. Hydrocodone  was tried but not tolerated.  She takes omeprazole  40 mg for stomach issues, recently changed from Prilosec to a similar medication.        ROS   Current Medications[1]      Objective:  BP 117/62   Pulse 74   Ht 5' 4 (1.626 m) Comment: per pt  Wt 240 lb 3.2 oz (109 kg)   SpO2 98%   BMI 41.23 kg/m     Physical Exam Constitutional:      General: She is not in acute distress.    Appearance: Normal appearance. She is obese.  Eyes:     General: No scleral icterus.    Conjunctiva/sclera: Conjunctivae normal.  Cardiovascular:     Rate and Rhythm: Normal rate and regular rhythm.  Pulmonary:     Breath sounds: Rales (bibasilar) present. No wheezing or rhonchi.  Musculoskeletal:     Right lower leg: No edema.     Left lower leg: No edema.   Skin:    General: Skin is warm and dry.  Neurological:     General: No focal deficit present.      Diagnostic Review:  Last CBC Lab Results  Component Value Date   WBC 8.7 07/14/2023   HGB 9.9 (L) 07/14/2023   HCT 31.4 (L) 07/14/2023   MCV 80.7 07/14/2023   MCH 28.5 07/05/2021   RDW 17.7 (H) 07/14/2023   PLT 293.0 07/14/2023   Last metabolic panel Lab Results  Component Value Date   GLUCOSE 96 07/14/2023   NA 139 07/14/2023   K 4.3 07/14/2023   CL 103 07/14/2023   CO2 26 07/14/2023   BUN 13 07/14/2023   CREATININE 1.04 07/14/2023   GFR 49.96 (L) 07/14/2023   CALCIUM  9.6 07/14/2023   PHOS 4.2 05/21/2019   PROT 7.2 07/14/2023   ALBUMIN 4.2 07/14/2023   BILITOT 0.4 07/14/2023   ALKPHOS 54 07/14/2023   AST 17 07/14/2023   ALT 8 07/14/2023   ANIONGAP 13 07/05/2021       Assessment & Plan:   Assessment & Plan ILD (interstitial lung disease) (HCC)  Orders:   Pulmonary Function Test; Future   CT CHEST HIGH RESOLUTION; Future  Nocturnal hypoxemia      Assessment and Plan Assessment & Plan Interstitial lung disease Chronic interstitial lung disease with  scarring. Discontinued pirfenidone . Occasional nocturnal hypoxemia managed with supplemental oxygen . Low to intermediate surgical risk. - Ordered updated CT scan of the chest. - Scheduled pulmonary function tests. - Will discuss potential reintroduction of pirfenidone  after reviewing test results.  Nocturnal hypoxemia Intermittent nocturnal hypoxemia managed with supplemental oxygen . No nebulizer or Pulmicort  use. - Continue nocturnal oxygen  therapy as needed.  Pre-Op Respiratory Evaluation ARISCAT Score for Postoperative Pulmonary Complications Low to Intermediate risk 1.6% - 13.3% risk of in-hospital post-op pulmonary complications (composite including respiratory failure, respiratory infection, pleural effusion, atelectasis, pneumothorax, bronchospasm, aspiration pneumonitis)    Return in about 2  months (around 11/20/2024) for f/u visit Dr. Kara.   Dorn KATHEE Kara, MD     [1]  Current Outpatient Medications:    albuterol  (VENTOLIN  HFA) 108 (90 Base) MCG/ACT inhaler, Inhale 2 puffs into the lungs every 4 (four) hours as needed for wheezing or shortness of breath., Disp: , Rfl:    ALPRAZolam  (XANAX ) 0.5 MG tablet, Take 0.5 mg by mouth every 8 (eight) hours as needed for anxiety. , Disp: , Rfl:    Calcium -Vitamin D 600-125 MG-UNIT TABS, Take 1 tablet by mouth 2 (two) times a week. , Disp: , Rfl:    ferrous sulfate 325 (65 FE) MG tablet, Take 325 mg by mouth daily with breakfast., Disp: , Rfl:    gabapentin (NEURONTIN) 600 MG tablet, Take 600 mg by mouth 2 (two) times daily., Disp: , Rfl:    Multiple Vitamin (MULTIVITAMIN) capsule, Take 1 capsule by mouth 2 (two) times a week. , Disp: , Rfl:    omeprazole  (PRILOSEC) 40 MG capsule, Take 1 capsule (40 mg total) by mouth 2 (two) times daily., Disp: 60 capsule, Rfl: 3   pravastatin  (PRAVACHOL ) 80 MG tablet, Take 1 tablet (80 mg total) by mouth every evening., Disp: 90 tablet, Rfl: 3   rosuvastatin (CRESTOR) 20 MG tablet, Take 20 mg by mouth at bedtime., Disp: , Rfl:   "

## 2024-09-22 NOTE — Patient Instructions (Addendum)
 Schedule pulmonary function tests at the front desk   We will schedule you for CT Chest scan this week or next week  Continue 2L of oxygen  at nighttime with sleep  For your upcoming surgery: ARISCAT Score for Postoperative Pulmonary Complications Low to Intermediate risk 1.6% - 13.3% risk of in-hospital post-op pulmonary complications (composite including respiratory failure, respiratory infection, pleural effusion, atelectasis, pneumothorax, bronchospasm, aspiration pneumonitis)   Follow up in 2 months to review breathing tests and CT Chest scan

## 2024-09-29 ENCOUNTER — Other Ambulatory Visit

## 2024-09-29 NOTE — Telephone Encounter (Signed)
 Ov note 09/22/24 faxed to [facility]

## 2024-10-01 ENCOUNTER — Other Ambulatory Visit: Payer: Self-pay | Admitting: Neurosurgery

## 2024-10-12 ENCOUNTER — Encounter (HOSPITAL_COMMUNITY): Admission: RE | Payer: Self-pay | Source: Home / Self Care

## 2024-10-12 ENCOUNTER — Ambulatory Visit (HOSPITAL_COMMUNITY): Admission: RE | Admit: 2024-10-12 | Source: Home / Self Care | Admitting: Neurosurgery

## 2024-10-22 ENCOUNTER — Other Ambulatory Visit

## 2024-11-23 ENCOUNTER — Ambulatory Visit: Admitting: Pulmonary Disease

## 2024-11-26 ENCOUNTER — Ambulatory Visit: Admitting: Pulmonary Disease

## 2024-11-26 ENCOUNTER — Encounter
